# Patient Record
Sex: Female | Born: 1985 | Race: Black or African American | Hispanic: No | Marital: Single | Smoking: Current every day smoker
Health system: Southern US, Community
[De-identification: ages and names within clinical notes are randomized; demographics above are authoritative.]

## PROBLEM LIST (undated history)

## (undated) ENCOUNTER — Inpatient Hospital Stay (HOSPITAL_COMMUNITY): Payer: Self-pay

## (undated) DIAGNOSIS — R87629 Unspecified abnormal cytological findings in specimens from vagina: Secondary | ICD-10-CM

## (undated) HISTORY — PX: TONSILLECTOMY: SUR1361

---
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
## 2008-01-12 ENCOUNTER — Inpatient Hospital Stay (HOSPITAL_COMMUNITY): Admission: AD | Admit: 2008-01-12 | Discharge: 2008-01-12 | Payer: Self-pay | Admitting: Family Medicine

## 2008-03-20 ENCOUNTER — Inpatient Hospital Stay (HOSPITAL_COMMUNITY): Admission: AD | Admit: 2008-03-20 | Discharge: 2008-03-20 | Payer: Self-pay | Admitting: Obstetrics and Gynecology

## 2008-08-27 ENCOUNTER — Inpatient Hospital Stay (HOSPITAL_COMMUNITY): Admission: AD | Admit: 2008-08-27 | Discharge: 2008-08-30 | Payer: Self-pay | Admitting: Obstetrics and Gynecology

## 2008-11-16 ENCOUNTER — Emergency Department (HOSPITAL_COMMUNITY): Admission: EM | Admit: 2008-11-16 | Discharge: 2008-11-16 | Payer: Self-pay | Admitting: Emergency Medicine

## 2009-07-15 ENCOUNTER — Inpatient Hospital Stay (HOSPITAL_COMMUNITY): Admission: AD | Admit: 2009-07-15 | Discharge: 2009-07-15 | Payer: Self-pay | Admitting: Obstetrics & Gynecology

## 2009-11-19 ENCOUNTER — Emergency Department (HOSPITAL_COMMUNITY): Admission: EM | Admit: 2009-11-19 | Discharge: 2009-11-19 | Payer: Self-pay | Admitting: Emergency Medicine

## 2010-02-03 ENCOUNTER — Inpatient Hospital Stay (HOSPITAL_COMMUNITY): Admission: AD | Admit: 2010-02-03 | Discharge: 2010-02-05 | Payer: Self-pay | Admitting: Obstetrics and Gynecology

## 2010-06-08 ENCOUNTER — Emergency Department (HOSPITAL_COMMUNITY)
Admission: EM | Admit: 2010-06-08 | Discharge: 2010-06-08 | Payer: Self-pay | Source: Home / Self Care | Admitting: Emergency Medicine

## 2010-08-06 ENCOUNTER — Emergency Department (HOSPITAL_COMMUNITY)
Admission: EM | Admit: 2010-08-06 | Discharge: 2010-08-06 | Disposition: A | Discharge status: Home / Self Care | Payer: BC Managed Care – PPO | Attending: Emergency Medicine | Admitting: Emergency Medicine

## 2010-08-06 DIAGNOSIS — M4120 Other idiopathic scoliosis, site unspecified: Secondary | ICD-10-CM | POA: Insufficient documentation

## 2010-08-06 DIAGNOSIS — J45909 Unspecified asthma, uncomplicated: Secondary | ICD-10-CM | POA: Insufficient documentation

## 2010-08-06 DIAGNOSIS — R300 Dysuria: Secondary | ICD-10-CM | POA: Insufficient documentation

## 2010-08-06 DIAGNOSIS — R110 Nausea: Secondary | ICD-10-CM | POA: Insufficient documentation

## 2010-08-06 DIAGNOSIS — N390 Urinary tract infection, site not specified: Secondary | ICD-10-CM | POA: Insufficient documentation

## 2010-08-06 DIAGNOSIS — R109 Unspecified abdominal pain: Secondary | ICD-10-CM | POA: Insufficient documentation

## 2010-08-06 LAB — URINALYSIS, ROUTINE W REFLEX MICROSCOPIC
Bilirubin Urine: NEGATIVE
Glucose, UA: NEGATIVE mg/dL
Ketones, ur: NEGATIVE mg/dL
Nitrite: NEGATIVE
Protein, ur: 30 mg/dL — AB
Specific Gravity, Urine: 1.023 (ref 1.005–1.030)
Urobilinogen, UA: 1 mg/dL (ref 0.0–1.0)
pH: 8 (ref 5.0–8.0)

## 2010-08-06 LAB — URINE MICROSCOPIC-ADD ON

## 2010-08-06 LAB — POCT PREGNANCY, URINE: Preg Test, Ur: NEGATIVE

## 2010-08-08 LAB — CBC
HCT: 30 % — ABNORMAL LOW (ref 36.0–46.0)
HCT: 31.2 % — ABNORMAL LOW (ref 36.0–46.0)
Hemoglobin: 10.4 g/dL — ABNORMAL LOW (ref 12.0–15.0)
Hemoglobin: 10.9 g/dL — ABNORMAL LOW (ref 12.0–15.0)
MCH: 32.6 pg (ref 26.0–34.0)
MCH: 32.9 pg (ref 26.0–34.0)
MCHC: 34.7 g/dL (ref 30.0–36.0)
MCHC: 34.7 g/dL (ref 30.0–36.0)
MCV: 93.8 fL (ref 78.0–100.0)
MCV: 94.8 fL (ref 78.0–100.0)
Platelets: 248 10*3/uL (ref 150–400)
Platelets: 281 10*3/uL (ref 150–400)
RBC: 3.16 MIL/uL — ABNORMAL LOW (ref 3.87–5.11)
RBC: 3.33 MIL/uL — ABNORMAL LOW (ref 3.87–5.11)
RDW: 13.9 % (ref 11.5–15.5)
RDW: 14.2 % (ref 11.5–15.5)
WBC: 13.6 10*3/uL — ABNORMAL HIGH (ref 4.0–10.5)
WBC: 14.8 10*3/uL — ABNORMAL HIGH (ref 4.0–10.5)

## 2010-08-08 LAB — RPR: RPR Ser Ql: NONREACTIVE

## 2010-09-04 LAB — CBC
HCT: 29.1 % — ABNORMAL LOW (ref 36.0–46.0)
HCT: 34.5 % — ABNORMAL LOW (ref 36.0–46.0)
Hemoglobin: 11.9 g/dL — ABNORMAL LOW (ref 12.0–15.0)
Hemoglobin: 9.9 g/dL — ABNORMAL LOW (ref 12.0–15.0)
MCHC: 33.9 g/dL (ref 30.0–36.0)
MCHC: 34.5 g/dL (ref 30.0–36.0)
MCV: 93.8 fL (ref 78.0–100.0)
MCV: 95.5 fL (ref 78.0–100.0)
Platelets: 213 10*3/uL (ref 150–400)
Platelets: 293 10*3/uL (ref 150–400)
RBC: 3.04 MIL/uL — ABNORMAL LOW (ref 3.87–5.11)
RBC: 3.68 MIL/uL — ABNORMAL LOW (ref 3.87–5.11)
RDW: 14.2 % (ref 11.5–15.5)
RDW: 14.5 % (ref 11.5–15.5)
WBC: 11.6 10*3/uL — ABNORMAL HIGH (ref 4.0–10.5)
WBC: 11.6 10*3/uL — ABNORMAL HIGH (ref 4.0–10.5)

## 2010-09-04 LAB — URINE MICROSCOPIC-ADD ON

## 2010-09-04 LAB — URINALYSIS, ROUTINE W REFLEX MICROSCOPIC
Bilirubin Urine: NEGATIVE
Glucose, UA: NEGATIVE mg/dL
Ketones, ur: NEGATIVE mg/dL
Leukocytes, UA: NEGATIVE
Nitrite: NEGATIVE
Protein, ur: NEGATIVE mg/dL
Specific Gravity, Urine: 1.02 (ref 1.005–1.030)
Urobilinogen, UA: 0.2 mg/dL (ref 0.0–1.0)
pH: 6.5 (ref 5.0–8.0)

## 2010-09-04 LAB — RPR: RPR Ser Ql: NONREACTIVE
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
## 2011-02-25 LAB — URINALYSIS, ROUTINE W REFLEX MICROSCOPIC
Bilirubin Urine: NEGATIVE
Glucose, UA: NEGATIVE
Hgb urine dipstick: NEGATIVE
Ketones, ur: NEGATIVE
Nitrite: NEGATIVE
Protein, ur: NEGATIVE
Specific Gravity, Urine: 1.025
Urobilinogen, UA: 0.2
pH: 5.5

## 2011-02-25 LAB — GC/CHLAMYDIA PROBE AMP, GENITAL
Chlamydia, DNA Probe: NEGATIVE
GC Probe Amp, Genital: NEGATIVE

## 2011-02-28 ENCOUNTER — Emergency Department (HOSPITAL_COMMUNITY)
Admission: EM | Admit: 2011-02-28 | Discharge: 2011-02-28 | Discharge status: Against Medical Advice | Payer: Self-pay | Attending: Emergency Medicine | Admitting: Emergency Medicine

## 2011-06-20 ENCOUNTER — Emergency Department (HOSPITAL_COMMUNITY)
Admission: EM | Admit: 2011-06-20 | Discharge: 2011-06-20 | Disposition: A | Discharge status: Home / Self Care | Payer: Medicaid Other | Source: Home / Self Care | Attending: Family Medicine | Admitting: Family Medicine

## 2011-06-20 ENCOUNTER — Encounter (HOSPITAL_COMMUNITY): Payer: Self-pay | Admitting: *Deleted

## 2011-06-20 DIAGNOSIS — N760 Acute vaginitis: Secondary | ICD-10-CM

## 2011-06-20 LAB — POCT URINALYSIS DIP (DEVICE)
Bilirubin Urine: NEGATIVE
Glucose, UA: NEGATIVE mg/dL
Hgb urine dipstick: NEGATIVE
Leukocytes, UA: NEGATIVE
Nitrite: NEGATIVE
Protein, ur: NEGATIVE mg/dL
Specific Gravity, Urine: 1.02 (ref 1.005–1.030)
Urobilinogen, UA: 0.2 mg/dL (ref 0.0–1.0)
pH: 5.5 (ref 5.0–8.0)

## 2011-06-20 LAB — WET PREP, GENITAL
Trich, Wet Prep: NONE SEEN
Yeast Wet Prep HPF POC: NONE SEEN

## 2011-06-20 LAB — POCT PREGNANCY, URINE: Preg Test, Ur: NEGATIVE

## 2011-06-20 MED ORDER — AZITHROMYCIN 250 MG PO TABS
1000.0000 mg | ORAL_TABLET | Freq: Every day | ORAL | Status: DC
  Administered 2011-06-20 (×2): 1000 mg via ORAL

## 2011-06-20 MED ORDER — AZITHROMYCIN 250 MG PO TABS
ORAL_TABLET | ORAL | Status: AC
  Filled 2011-06-20: qty 4

## 2011-06-20 MED ORDER — METRONIDAZOLE 250 MG PO TABS: 250.0000 mg | ORAL_TABLET | Freq: Three times a day (TID) | ORAL | Status: AC

## 2011-06-20 NOTE — ED Notes (Signed)
PT  REPORTS  SYMPTOMS  OF  VAGINAL  DISCHARGE  AND  R  SIDE  PAIN X  3  DAYS    SHE  APPEARS  IN NO  ACUTE  DISTRESS    SITTING  URIGHT    ON  EXAM TABLE  SPEAKING IN  COMPLETE  SENTANCES

## 2011-06-20 NOTE — ED Provider Notes (Signed)
History     CSN: [id number]  Arrival date & time 06/20/11  1205   First MD Initiated Contact with Patient 06/20/11 1228      Chief Complaint  Patient presents with  . Vaginal Discharge    (Consider location/radiation/quality/duration/timing/severity/associated sxs/prior treatment) Patient is a 26 y.o. female presenting with vaginal discharge. The history is provided by the patient.  Vaginal Discharge This is a new (h/o chlamydia.) problem. The current episode started more than 2 days ago. The problem occurs constantly. The problem has been gradually worsening. Associated symptoms include abdominal pain.    Past Medical History  Diagnosis Date  . Asthma     History reviewed. No pertinent past surgical history.  Family History  Problem Relation Age of Onset  . Cancer Other   . Diabetes Other   . Hypertension Other     History  Substance Use Topics  . Smoking status: Current Everyday Smoker  . Smokeless tobacco: Not on file  . Alcohol Use: No    OB History    Grav Para Term Preterm Abortions TAB SAB Ect Mult Living                  Review of Systems  Constitutional: Negative.   Gastrointestinal: Positive for abdominal pain. Negative for nausea and vomiting.  Genitourinary: Positive for vaginal discharge. Negative for dysuria, urgency and frequency.    Allergies  Penicillins  Home Medications   Current Outpatient Rx  Name Route Sig Dispense Refill  . ALBUTEROL SULFATE HFA 108 (90 BASE) MCG/ACT IN AERS Inhalation Inhale 2 puffs into the lungs every 6 (six) hours as needed.    Marland Kitchen METRONIDAZOLE 250 MG PO TABS Oral Take 1 tablet (250 mg total) by mouth 3 (three) times daily. 21 tablet 0    BP 114/62  Pulse 65  Temp 98.5 F (36.9 C)  Resp 20  SpO2 100%  LMP 05/10/2011  Physical Exam  Nursing note and vitals reviewed. Constitutional: She appears well-developed and well-nourished.  Abdominal: Soft. Bowel sounds are normal. She exhibits no distension and  no mass. There is no tenderness. There is no rebound and no guarding.  Genitourinary: Uterus normal. Cervix exhibits no motion tenderness, no discharge and no friability. There is erythema and tenderness around the vagina. Vaginal discharge found.    ED Course  Procedures (including critical care time)  Labs Reviewed  POCT URINALYSIS DIP (DEVICE) - Abnormal; Notable for the following:    Ketones, ur TRACE (*)    All other components within normal limits  POCT PREGNANCY, URINE  POCT URINALYSIS DIPSTICK  POCT PREGNANCY, URINE  GC/CHLAMYDIA PROBE AMP, GENITAL  WET PREP, GENITAL   No results found.   1. Vaginitis       MDM  U/a and preg. NegBarkley Bruns, MD 06/20/11 1400

## 2011-06-21 LAB — GC/CHLAMYDIA PROBE AMP, GENITAL
Chlamydia, DNA Probe: NEGATIVE
GC Probe Amp, Genital: NEGATIVE

## 2011-06-23 NOTE — ED Notes (Signed)
GC/Chlamyida neg., Wet prep: Many clue cells, many WBC's. Pt. adequately treated with Flagyl. Cherly Anderson M\ 06/23/2011

## 2012-11-15 ENCOUNTER — Emergency Department (HOSPITAL_COMMUNITY): Payer: 59

## 2012-11-15 ENCOUNTER — Encounter (HOSPITAL_COMMUNITY): Payer: Self-pay | Admitting: *Deleted

## 2012-11-15 ENCOUNTER — Emergency Department (HOSPITAL_COMMUNITY)
Admission: EM | Admit: 2012-11-15 | Discharge: 2012-11-16 | Disposition: A | Discharge status: Home / Self Care | Payer: 59 | Attending: Emergency Medicine | Admitting: Emergency Medicine

## 2012-11-15 DIAGNOSIS — Y9389 Activity, other specified: Secondary | ICD-10-CM | POA: Insufficient documentation

## 2012-11-15 DIAGNOSIS — F17200 Nicotine dependence, unspecified, uncomplicated: Secondary | ICD-10-CM | POA: Insufficient documentation

## 2012-11-15 DIAGNOSIS — IMO0002 Reserved for concepts with insufficient information to code with codable children: Secondary | ICD-10-CM | POA: Insufficient documentation

## 2012-11-15 DIAGNOSIS — Z880 Allergy status to penicillin: Secondary | ICD-10-CM | POA: Insufficient documentation

## 2012-11-15 DIAGNOSIS — J45909 Unspecified asthma, uncomplicated: Secondary | ICD-10-CM | POA: Insufficient documentation

## 2012-11-15 DIAGNOSIS — S46911A Strain of unspecified muscle, fascia and tendon at shoulder and upper arm level, right arm, initial encounter: Secondary | ICD-10-CM

## 2012-11-15 DIAGNOSIS — Z79899 Other long term (current) drug therapy: Secondary | ICD-10-CM | POA: Insufficient documentation

## 2012-11-15 DIAGNOSIS — Y92410 Unspecified street and highway as the place of occurrence of the external cause: Secondary | ICD-10-CM | POA: Insufficient documentation
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
## 2012-11-16 MED ORDER — TRAMADOL HCL 50 MG PO TABS: 50.0000 mg | ORAL_TABLET | Freq: Four times a day (QID) | ORAL | Status: DC | PRN

## 2012-11-16 MED ORDER — IBUPROFEN 600 MG PO TABS: 600.0000 mg | ORAL_TABLET | Freq: Four times a day (QID) | ORAL | Status: DC | PRN

## 2012-11-16 MED ORDER — CYCLOBENZAPRINE HCL 10 MG PO TABS: 10.0000 mg | ORAL_TABLET | Freq: Two times a day (BID) | ORAL | Status: DC | PRN

## 2012-11-16 NOTE — ED Provider Notes (Signed)
Medical screening examination/treatment/procedure(s) were performed by non-physician practitioner and as supervising physician I was immediately available for consultation/collaboration.  Danille Oppedisano, MD 11/16/12 1441 

## 2013-01-23 ENCOUNTER — Encounter (HOSPITAL_COMMUNITY): Payer: Self-pay | Admitting: Physical Medicine and Rehabilitation

## 2013-01-23 ENCOUNTER — Emergency Department (HOSPITAL_COMMUNITY)
Admission: EM | Admit: 2013-01-23 | Discharge: 2013-01-23 | Disposition: A | Discharge status: Home / Self Care | Payer: 59 | Attending: Emergency Medicine | Admitting: Emergency Medicine

## 2013-01-23 DIAGNOSIS — Z880 Allergy status to penicillin: Secondary | ICD-10-CM | POA: Insufficient documentation

## 2013-01-23 DIAGNOSIS — J45909 Unspecified asthma, uncomplicated: Secondary | ICD-10-CM | POA: Insufficient documentation

## 2013-01-23 DIAGNOSIS — F17200 Nicotine dependence, unspecified, uncomplicated: Secondary | ICD-10-CM | POA: Insufficient documentation

## 2013-01-23 DIAGNOSIS — Z79899 Other long term (current) drug therapy: Secondary | ICD-10-CM | POA: Insufficient documentation

## 2013-01-23 DIAGNOSIS — K089 Disorder of teeth and supporting structures, unspecified: Secondary | ICD-10-CM | POA: Insufficient documentation

## 2013-01-23 DIAGNOSIS — K0889 Other specified disorders of teeth and supporting structures: Secondary | ICD-10-CM

## 2013-01-23 DIAGNOSIS — G529 Cranial nerve disorder, unspecified: Secondary | ICD-10-CM | POA: Insufficient documentation

## 2013-01-23 MED ORDER — CLINDAMYCIN HCL 150 MG PO CAPS: 300.0000 mg | ORAL_CAPSULE | Freq: Three times a day (TID) | ORAL | Status: DC

## 2013-01-23 NOTE — ED Provider Notes (Signed)
Medical screening examination/treatment/procedure(s) were performed by non-physician practitioner and as supervising physician I was immediately available for consultation/collaboration.    Ceniyah Thorp D Deidrick Rainey, MD 01/23/13 1943 

## 2013-01-23 NOTE — ED Notes (Signed)
Pt presents to department for evaluation of R upper molar pain. Ongoing x2 days. 10/10 pain upon arrival. No facial swelling noted. Respirations unlabored.

## 2013-01-23 NOTE — ED Provider Notes (Signed)
CSN: [id number]     Arrival date & time 01/23/13  1332 History  This chart was scribed for non-physician practitioner Elpidio Anis, PA-C, working with Vida Roller, MD, by Yevette Edwards, ED Scribe. This patient was seen in room TR04C/TR04C and the patient's care was started at 3:26 PM.   First MD Initiated Contact with Patient 01/23/13 1521   Chief Complaint  Patient presents with  . Dental Pain    The history is provided by the patient. No language interpreter was used.   HPI Comments: Donna Parks is a 27 y.o. female who presents to the Emergency Department complaining of gradually-increasing upper, right-sided dental pain which has been occurring for two days. The pt rates the pain as 10/10. She had a root canal performed several years ago on the affected tooth. The pt denies facial swelling or a fever. Yesterday, she began retaking an antibiotic which she was prescribed approximately two months ago after she had her wisdom teeth removed and which she did not originally finish. She is also taking percocet and motrin 800 mg for the current pain, but with little resolution.   Past Medical History  Diagnosis Date  . Asthma    No past surgical history on file. Family History  Problem Relation Age of Onset  . Cancer Other   . Diabetes Other   . Hypertension Other    History  Substance Use Topics  . Smoking status: Current Every Day Smoker -- 0.50 packs/day    Types: Cigarettes  . Smokeless tobacco: Not on file  . Alcohol Use: No   No OB history provided.  Review of Systems  Constitutional: Negative for fever and chills.  HENT: Positive for dental problem. Negative for facial swelling.   All other systems reviewed and are negative.   Allergies  Penicillins  Home Medications   Current Outpatient Rx  Name  Route  Sig  Dispense  Refill  . albuterol (PROVENTIL HFA;VENTOLIN HFA) 108 (90 BASE) MCG/ACT inhaler   Inhalation   Inhale 2 puffs into the lungs every 6 (six)  hours as needed for wheezing or shortness of breath.          . cyclobenzaprine (FLEXERIL) 10 MG tablet   Oral   Take 1 tablet (10 mg total) by mouth 2 (two) times daily as needed for muscle spasms.   20 tablet   0   . ibuprofen (ADVIL,MOTRIN) 600 MG tablet   Oral   Take 1 tablet (600 mg total) by mouth every 6 (six) hours as needed for pain.   30 tablet   0   . ibuprofen (ADVIL,MOTRIN) 800 MG tablet   Oral   Take 800 mg by mouth every 8 (eight) hours as needed for pain.         . traMADol (ULTRAM) 50 MG tablet   Oral   Take 1 tablet (50 mg total) by mouth every 6 (six) hours as needed for pain.   15 tablet   0    Triage Vitals: BP 137/77  Pulse 68  Temp(Src) 98 F (36.7 C) (Oral)  Resp 18  SpO2 98%  Physical Exam  Nursing note and vitals reviewed. Constitutional: She is oriented to person, place, and time. She appears well-developed and well-nourished. No distress.  HENT:  Head: Normocephalic and atraumatic.  No submental or cervical adenopathy.  Upper right second molar with mild decay. No surrounding abscess evident. Tender alvelolar ridge. Generally good dentition.   Eyes: EOM are normal.  Neck: Neck supple. No tracheal deviation present.  Cardiovascular: Normal rate.   Pulmonary/Chest: Effort normal. No respiratory distress.  Musculoskeletal: Normal range of motion.  Neurological: She is alert and oriented to person, place, and time. A cranial nerve deficit is present.  Skin: Skin is warm and dry.  Psychiatric: She has a normal mood and affect. Her behavior is normal.    ED Course  Procedures (including critical care time) Attempt to relieve pain with local nerve block with marcaine unsuccessful.  DIAGNOSTIC STUDIES: Oxygen Saturation is 98% on room air, normal by my interpretation.    COORDINATION OF CARE:  3:32 PM-Discussed treatment plan with patient, and the patient agreed to the plan.   3:38 PM- Numbed the pt's tooth.    Labs Review Labs  Reviewed - No data to display Imaging Review No results found.  MDM  No diagnosis found. 1. Dental pain  Patient is already taking Rx Percocet (10/325) and Clindamycin in an appropriate dose. She has outpatient follow up with dentist. Discussed ibuprofen use. She states she is ready for discharge - all questions answered.  I personally performed the services described in this documentation, which was scribed in my presence. The recorded information has been reviewed and is accurate.      Arnoldo Hooker, PA-C 01/23/13 1704

## 2013-01-23 NOTE — ED Notes (Signed)
Unable to locate

## 2013-08-23 ENCOUNTER — Encounter (HOSPITAL_COMMUNITY): Payer: Self-pay | Admitting: Emergency Medicine

## 2013-08-23 ENCOUNTER — Emergency Department (HOSPITAL_COMMUNITY)
Admission: EM | Admit: 2013-08-23 | Discharge: 2013-08-23 | Disposition: A | Discharge status: Home / Self Care | Payer: Medicaid Other | Attending: Emergency Medicine | Admitting: Emergency Medicine

## 2013-08-23 DIAGNOSIS — Z880 Allergy status to penicillin: Secondary | ICD-10-CM | POA: Insufficient documentation

## 2013-08-23 DIAGNOSIS — J45909 Unspecified asthma, uncomplicated: Secondary | ICD-10-CM | POA: Insufficient documentation

## 2013-08-23 DIAGNOSIS — F17200 Nicotine dependence, unspecified, uncomplicated: Secondary | ICD-10-CM | POA: Insufficient documentation

## 2013-08-23 DIAGNOSIS — R52 Pain, unspecified: Secondary | ICD-10-CM | POA: Insufficient documentation

## 2013-08-23 DIAGNOSIS — Z3202 Encounter for pregnancy test, result negative: Secondary | ICD-10-CM | POA: Insufficient documentation

## 2013-08-23 DIAGNOSIS — Z792 Long term (current) use of antibiotics: Secondary | ICD-10-CM | POA: Insufficient documentation

## 2013-08-23 DIAGNOSIS — J020 Streptococcal pharyngitis: Secondary | ICD-10-CM | POA: Insufficient documentation

## 2013-08-23 LAB — URINALYSIS, ROUTINE W REFLEX MICROSCOPIC
Bilirubin Urine: NEGATIVE
Glucose, UA: NEGATIVE mg/dL
Hgb urine dipstick: NEGATIVE
Ketones, ur: NEGATIVE mg/dL
LEUKOCYTES UA: NEGATIVE
NITRITE: NEGATIVE
PH: 5.5 (ref 5.0–8.0)
Protein, ur: NEGATIVE mg/dL
SPECIFIC GRAVITY, URINE: 1.025 (ref 1.005–1.030)
Urobilinogen, UA: 0.2 mg/dL (ref 0.0–1.0)

## 2013-08-23 LAB — RAPID STREP SCREEN (MED CTR MEBANE ONLY): Streptococcus, Group A Screen (Direct): POSITIVE — AB

## 2013-08-23 LAB — PREGNANCY, URINE: PREG TEST UR: NEGATIVE

## 2013-08-23 MED ORDER — HYDROCODONE-ACETAMINOPHEN 7.5-325 MG/15ML PO SOLN: 15.0000 mL | Freq: Three times a day (TID) | ORAL | Status: DC | PRN

## 2013-08-23 MED ORDER — AZITHROMYCIN 250 MG PO TABS: 250.0000 mg | ORAL_TABLET | Freq: Every day | ORAL | Status: DC

## 2013-08-23 NOTE — ED Notes (Signed)
Pt in c/o cough, congestion, body aches, and sore throat over the last week, intermittent, fever, no distress noted

## 2013-08-23 NOTE — Discharge Instructions (Signed)
Call for a follow up appointment with a Family or Primary Care Provider.  °Return if Symptoms worsen.   °Take medication as prescribed.  ° °

## 2013-08-23 NOTE — ED Provider Notes (Signed)
CSN: [id number]     Arrival date & time 08/23/13  0940 History   First MD Initiated Contact with Patient 08/23/13 0945     No chief complaint on file.    (Consider location/radiation/quality/duration/timing/severity/associated sxs/prior Treatment) HPI Comments: Donna Parks is a 28 y.o. female presenting the Emergency Department with a chief complaint of persistent sore throat for 1 week.  She reports associated generalize body aches and weakness.  She reports intermittent fever.  The patient also complains of one episode of emesis 5 days ago.  She reports minima relief of symptoms with ibuprofen. LNMP: 2 weeks ago   The history is provided by the patient. No language interpreter was used.    Past Medical History  Diagnosis Date  . Asthma    No past surgical history on file. Family History  Problem Relation Age of Onset  . Cancer Other   . Diabetes Other   . Hypertension Other    History  Substance Use Topics  . Smoking status: Current Every Day Smoker -- 0.50 packs/day    Types: Cigarettes  . Smokeless tobacco: Not on file  . Alcohol Use: No   OB History   Grav Para Term Preterm Abortions TAB SAB Ect Mult Living                 Review of Systems  Constitutional: Positive for fever, chills and fatigue.  HENT: Positive for sinus pressure and sneezing. Negative for congestion and ear pain.   Respiratory: Negative for cough and shortness of breath.   Genitourinary: Negative for dysuria, frequency, hematuria and flank pain.  Musculoskeletal: Positive for myalgias.  Skin: Negative for rash.      Allergies  Penicillins  Home Medications   Current Outpatient Rx  Name  Route  Sig  Dispense  Refill  . albuterol (PROVENTIL HFA;VENTOLIN HFA) 108 (90 BASE) MCG/ACT inhaler   Inhalation   Inhale 2 puffs into the lungs every 6 (six) hours as needed for wheezing or shortness of breath.          . clindamycin (CLEOCIN) 150 MG capsule   Oral   Take 2 capsules (300 mg  total) by mouth 3 (three) times daily.   15 capsule   0   . cyclobenzaprine (FLEXERIL) 10 MG tablet   Oral   Take 1 tablet (10 mg total) by mouth 2 (two) times daily as needed for muscle spasms.   20 tablet   0   . ibuprofen (ADVIL,MOTRIN) 600 MG tablet   Oral   Take 1 tablet (600 mg total) by mouth every 6 (six) hours as needed for pain.   30 tablet   0   . ibuprofen (ADVIL,MOTRIN) 800 MG tablet   Oral   Take 800 mg by mouth every 8 (eight) hours as needed for pain.         . traMADol (ULTRAM) 50 MG tablet   Oral   Take 1 tablet (50 mg total) by mouth every 6 (six) hours as needed for pain.   15 tablet   0    There were no vitals taken for this visit. Physical Exam  Nursing note and vitals reviewed. Constitutional: She appears well-developed and well-nourished. No distress.  HENT:  Head: Normocephalic and atraumatic.  Right Ear: Tympanic membrane and external ear normal. No middle ear effusion.  Left Ear: Tympanic membrane and external ear normal.  No middle ear effusion.  Nose: Rhinorrhea present.  Mouth/Throat: Uvula is midline and mucous membranes  are normal. Mucous membranes are not dry. No oral lesions. No trismus in the jaw. No uvula swelling. Oropharyngeal exudate and posterior oropharyngeal erythema present. No posterior oropharyngeal edema or tonsillar abscesses.  Eyes: EOM are normal. Pupils are equal, round, and reactive to light. Right eye exhibits no discharge. Left eye exhibits no discharge.  Neck: Normal range of motion. Neck supple.  Cardiovascular: Normal rate and regular rhythm.   No murmur heard. Pulmonary/Chest: Effort normal and breath sounds normal. No respiratory distress. She has no wheezes. She has no rales.  Abdominal: Soft. Normal appearance. She exhibits no distension. There is no tenderness. There is no rebound and no CVA tenderness.  Lymphadenopathy:       Head (right side): Tonsillar adenopathy present. No submental, no submandibular, no  preauricular, no posterior auricular and no occipital adenopathy present.       Head (left side): Tonsillar adenopathy present. No submental, no submandibular, no preauricular, no posterior auricular and no occipital adenopathy present.    She has no cervical adenopathy.  Skin: Skin is warm and dry. No rash noted. She is not diaphoretic.  Psychiatric: She has a normal mood and affect. Her behavior is normal. Thought content normal.    ED Course  Procedures (including critical care time) Labs Review Labs Reviewed  RAPID STREP SCREEN - Abnormal; Notable for the following:    Streptococcus, Group A Screen (Direct) POSITIVE (*)    All other components within normal limits  URINALYSIS, ROUTINE W REFLEX MICROSCOPIC - Abnormal; Notable for the following:    APPearance CLOUDY (*)    All other components within normal limits  PREGNANCY, URINE   Imaging Review No results found.   EKG Interpretation None      MDM   Final diagnoses:  Strep throat   Pt with ongoing sore throat for 1 week.  Report nausea an vomiting, no abdominal pain, UA to check for possible UTI. Strep positive, UA negative. PCN allergy, will discharge with a Z-pak  Discussed lab results and treatment plan with the patient. Return precautions given. Reports understanding and no other concerns at this time.  Patient is stable for discharge at this time. Meds given in ED:  Medications - No data to display  Discharge Medication List as of 08/23/2013 11:04 AM    START taking these medications   Details  azithromycin (ZITHROMAX Z-PAK) 250 MG tablet Take 1 tablet (250 mg total) by mouth daily. 500mg  PO day 1, then 250mg  PO days 205, Starting 08/23/2013, Until Discontinued, Print    HYDROcodone-acetaminophen (HYCET) 7.5-325 mg/15 ml solution Take 15 mLs by mouth every 8 (eight) hours as needed for moderate pain., Starting 08/23/2013, Until Discontinued, Print             Clabe SealLauren M Embry Manrique, PA-C 08/23/13 1119

## 2013-08-24 NOTE — ED Provider Notes (Signed)
Medical screening examination/treatment/procedure(s) were performed by non-physician practitioner and as supervising physician I was immediately available for consultation/collaboration.   EKG Interpretation None        Charles B. Bernette MayersSheldon, MD 08/24/13 [phone]

## 2013-09-27 ENCOUNTER — Encounter (HOSPITAL_COMMUNITY): Payer: Self-pay | Admitting: Emergency Medicine

## 2013-09-27 ENCOUNTER — Emergency Department (HOSPITAL_COMMUNITY)
Admission: EM | Admit: 2013-09-27 | Discharge: 2013-09-27 | Disposition: A | Discharge status: Home / Self Care | Payer: 59 | Attending: Emergency Medicine | Admitting: Emergency Medicine

## 2013-09-27 DIAGNOSIS — B9689 Other specified bacterial agents as the cause of diseases classified elsewhere: Secondary | ICD-10-CM | POA: Insufficient documentation

## 2013-09-27 DIAGNOSIS — Z880 Allergy status to penicillin: Secondary | ICD-10-CM | POA: Insufficient documentation

## 2013-09-27 DIAGNOSIS — F17200 Nicotine dependence, unspecified, uncomplicated: Secondary | ICD-10-CM | POA: Insufficient documentation

## 2013-09-27 DIAGNOSIS — Z79899 Other long term (current) drug therapy: Secondary | ICD-10-CM | POA: Insufficient documentation

## 2013-09-27 DIAGNOSIS — Z3202 Encounter for pregnancy test, result negative: Secondary | ICD-10-CM | POA: Insufficient documentation

## 2013-09-27 DIAGNOSIS — A499 Bacterial infection, unspecified: Secondary | ICD-10-CM | POA: Insufficient documentation

## 2013-09-27 DIAGNOSIS — N760 Acute vaginitis: Secondary | ICD-10-CM

## 2013-09-27 DIAGNOSIS — J45909 Unspecified asthma, uncomplicated: Secondary | ICD-10-CM | POA: Insufficient documentation

## 2013-09-27 LAB — COMPREHENSIVE METABOLIC PANEL
ALBUMIN: 3.7 g/dL (ref 3.5–5.2)
ALK PHOS: 59 U/L (ref 39–117)
ALT: 16 U/L (ref 0–35)
AST: 18 U/L (ref 0–37)
BILIRUBIN TOTAL: 0.2 mg/dL — AB (ref 0.3–1.2)
BUN: 16 mg/dL (ref 6–23)
CHLORIDE: 100 meq/L (ref 96–112)
CO2: 24 mEq/L (ref 19–32)
Calcium: 9.3 mg/dL (ref 8.4–10.5)
Creatinine, Ser: 0.91 mg/dL (ref 0.50–1.10)
GFR calc Af Amer: >90 mL/min (ref >90)
GFR calc non Af Amer: 87 mL/min — ABNORMAL LOW (ref >90)
Glucose, Bld: 101 mg/dL — ABNORMAL HIGH (ref 70–99)
POTASSIUM: 4.5 meq/L (ref 3.7–5.3)
Sodium: 135 mEq/L — ABNORMAL LOW (ref 137–147)
Total Protein: 7.1 g/dL (ref 6.0–8.3)

## 2013-09-27 LAB — CBC WITH DIFFERENTIAL/PLATELET
BASOS PCT: 0 % (ref 0–1)
Basophils Absolute: 0 10*3/uL (ref 0.0–0.1)
Eosinophils Absolute: 0.2 10*3/uL (ref 0.0–0.7)
Eosinophils Relative: 2 % (ref 0–5)
HCT: 41.6 % (ref 36.0–46.0)
HEMOGLOBIN: 14.3 g/dL (ref 12.0–15.0)
LYMPHS ABS: 3.3 10*3/uL (ref 0.7–4.0)
Lymphocytes Relative: 38 % (ref 12–46)
MCH: 32.1 pg (ref 26.0–34.0)
MCHC: 34.4 g/dL (ref 30.0–36.0)
MCV: 93.3 fL (ref 78.0–100.0)
MONOS PCT: 8 % (ref 3–12)
Monocytes Absolute: 0.7 10*3/uL (ref 0.1–1.0)
NEUTROS ABS: 4.5 10*3/uL (ref 1.7–7.7)
NEUTROS PCT: 52 % (ref 43–77)
Platelets: 297 10*3/uL (ref 150–400)
RBC: 4.46 MIL/uL (ref 3.87–5.11)
RDW: 13.7 % (ref 11.5–15.5)
WBC: 8.8 10*3/uL (ref 4.0–10.5)

## 2013-09-27 LAB — HIV ANTIBODY (ROUTINE TESTING W REFLEX): HIV 1&2 Ab, 4th Generation: NONREACTIVE

## 2013-09-27 LAB — URINE MICROSCOPIC-ADD ON

## 2013-09-27 LAB — URINALYSIS, ROUTINE W REFLEX MICROSCOPIC
BILIRUBIN URINE: NEGATIVE
GLUCOSE, UA: NEGATIVE mg/dL
HGB URINE DIPSTICK: NEGATIVE
KETONES UR: NEGATIVE mg/dL
Nitrite: NEGATIVE
PROTEIN: NEGATIVE mg/dL
Specific Gravity, Urine: 1.016 (ref 1.005–1.030)
Urobilinogen, UA: 0.2 mg/dL (ref 0.0–1.0)
pH: 6.5 (ref 5.0–8.0)

## 2013-09-27 LAB — WET PREP, GENITAL
TRICH WET PREP: NONE SEEN
YEAST WET PREP: NONE SEEN

## 2013-09-27 LAB — PREGNANCY, URINE: Preg Test, Ur: NEGATIVE

## 2013-09-27 LAB — RPR

## 2013-09-27 MED ORDER — IBUPROFEN 800 MG PO TABS
800.0000 mg | ORAL_TABLET | Freq: Once | ORAL | Status: AC
  Administered 2013-09-27: 800 mg via ORAL
  Filled 2013-09-27: qty 1

## 2013-09-27 MED ORDER — METRONIDAZOLE 500 MG PO TABS: 500.0000 mg | ORAL_TABLET | Freq: Two times a day (BID) | ORAL | Status: DC

## 2013-09-27 MED ORDER — HYDROCODONE-ACETAMINOPHEN 5-325 MG PO TABS: 1.0000 | ORAL_TABLET | Freq: Four times a day (QID) | ORAL | Status: DC | PRN

## 2013-09-27 MED ORDER — IBUPROFEN 800 MG PO TABS: 800.0000 mg | ORAL_TABLET | Freq: Three times a day (TID) | ORAL | Status: DC | PRN

## 2013-09-27 NOTE — Progress Notes (Signed)
[facility] provided pt with a list of primary care resources to help patient establish primary care.  °

## 2013-09-27 NOTE — Discharge Instructions (Signed)

## 2013-09-27 NOTE — ED Notes (Signed)
Pt c/o lower abd pain since yesterday; discharge x 1 wk; pap smear last year abnormal but pt did not return for follow up

## 2013-09-27 NOTE — ED Provider Notes (Signed)
TIME SEEN: 11:40 AM  CHIEF COMPLAINT: Abdominal pain, vaginal discharge  HPI: Patient is a 28 y.o. F G2P2 who presents to the emergency department with intermittent vaginal discharge and diffuse abdominal pain for the past year. She states her symptoms are present for the past week and she decided to come to the emergency department for evaluation. Her last menstrual period was in mid April. She has had a history of Chlamydia that was treated in the genital herpes. She is sexually active with one partner and they always use condoms. She denies any fevers, chills, nausea, vomiting or diarrhea. She has had some urinary frequency but no dysuria or hematuria.  ROS: See HPI Constitutional: no fever  Eyes: no drainage  ENT: no runny nose   Cardiovascular:  no chest pain  Resp: no SOB  GI: no vomiting GU: no dysuria Integumentary: no rash  Allergy: no hives  Musculoskeletal: no leg swelling  Neurological: no slurred speech ROS otherwise negative  PAST MEDICAL HISTORY/PAST SURGICAL HISTORY:  Past Medical History  Diagnosis Date  . Asthma     MEDICATIONS:  Prior to Admission medications   Medication Sig Start Date End Date Taking? Authorizing Provider  acetaminophen (TYLENOL) 325 MG tablet Take 650 mg by mouth every 6 (six) hours as needed (pain).   Yes Historical Provider, MD  albuterol (PROVENTIL HFA;VENTOLIN HFA) 108 (90 BASE) MCG/ACT inhaler Inhale 2 puffs into the lungs every 6 (six) hours as needed for wheezing or shortness of breath.     Historical Provider, MD    ALLERGIES:  Allergies  Allergen Reactions  . Penicillins Hives    SOCIAL HISTORY:  History  Substance Use Topics  . Smoking status: Current Every Day Smoker -- 0.50 packs/day    Types: Cigarettes  . Smokeless tobacco: Not on file  . Alcohol Use: No    FAMILY HISTORY: Family History  Problem Relation Age of Onset  . Cancer Other   . Diabetes Other   . Hypertension Other     EXAM: BP 136/100  Pulse 56   Temp(Src) 97.9 F (36.6 C) (Oral)  Resp 16  SpO2 100%  LMP 09/07/2013 CONSTITUTIONAL: Alert and oriented and responds appropriately to questions. Well-appearing; well-nourished HEAD: Normocephalic EYES: Conjunctivae clear, PERRL ENT: normal nose; no rhinorrhea; moist mucous membranes; pharynx without lesions noted NECK: Supple, no meningismus, no LAD  CARD: RRR; S1 and S2 appreciated; no murmurs, no clicks, no rubs, no gallops RESP: Normal chest excursion without splinting or tachypnea; breath sounds clear and equal bilaterally; no wheezes, no rhonchi, no rales,  ABD/GI: Normal bowel sounds; non-distended; soft, non-tender, no rebound, no guarding GU: Normal external genitalia, small amount of white vaginal discharge, no vaginal bleeding, no cervical motion tenderness, no adnexal tenderness or fullness BACK:  The back appears normal and is non-tender to palpation, there is no CVA tenderness EXT: Normal ROM in all joints; non-tender to palpation; no edema; normal capillary refill; no cyanosis    SKIN: Normal color for age and race; warm NEURO: Moves all extremities equally PSYCH: The patient's mood and manner are appropriate. Grooming and personal hygiene are appropriate.  MEDICAL DECISION MAKING: Patient is very well-appearing, nontoxic and in no apparent distress. Her abdominal exam is completely benign. Differential diagnosis includes pelvic inflammatory disease, UTI, ectopic pregnancy, TIA, less likely torsion or appendicitis. We'll obtain a urine for urinalysis and urine pregnancy, pelvic exam with cultures. Labs are already pending.  ED PROGRESS: Patient's pelvic exam is completely unremarkable. She has no cervical  motion tenderness or adnexal tenderness. Her labs are normal. Her urine shows trace leukocytes but no other sign of infection. Urine pregnancy is negative. Wet prep pending. I do not feel she needs abdominal imaging at this time.   Patient's wet prep shows clue cells but no  other sign of infection. We'll discharge home with prescription for Flagyl and return precautions. Patient states she's also been told she had abnormal Pap smear last year and she never followed up. Discussed with patient that we do not perform Pap smears in the emergency department and urged her to followup with her OB/GYN. Discussed return precautions. She verbalized understanding and is comfortable with plan.     Layla MawKristen N Ward, DO 09/27/13 [phone]

## 2013-09-27 NOTE — ED Notes (Addendum)
Per patient- had pap smear test completed last year and had "abnormal results" but didn't go back to hear the results because she heard the word "cancerous." Reports malodorous dark yellow discharge starting a month ago. Used monistat x2 with no results. Did report itching a month ago but no itching at this time. Still has darker yellow discharge at times. Abdominal pain generalized but denies radiation to shoulder, neck, or back. Last period in April. Hx chlamydia and was treated. Sexually active and using protection with one partner at this time. Denies N, V, chills, D. Ambulatory. A&O x4. No other complaints at this time.

## 2013-09-28 LAB — URINE CULTURE
CULTURE: NO GROWTH
Colony Count: NO GROWTH

## 2013-09-28 LAB — GC/CHLAMYDIA PROBE AMP
CT Probe RNA: NEGATIVE
GC Probe RNA: NEGATIVE

## 2014-08-18 ENCOUNTER — Inpatient Hospital Stay (HOSPITAL_COMMUNITY)
Admission: AD | Admit: 2014-08-18 | Discharge: 2014-08-18 | Disposition: A | Discharge status: Home / Self Care | Payer: Medicaid Other | Source: Ambulatory Visit | Attending: Family Medicine | Admitting: Family Medicine

## 2014-08-18 DIAGNOSIS — N644 Mastodynia: Secondary | ICD-10-CM | POA: Diagnosis not present

## 2014-08-18 DIAGNOSIS — N6452 Nipple discharge: Secondary | ICD-10-CM | POA: Diagnosis not present

## 2014-08-18 LAB — URINE MICROSCOPIC-ADD ON

## 2014-08-18 LAB — URINALYSIS, ROUTINE W REFLEX MICROSCOPIC
Bilirubin Urine: NEGATIVE
Glucose, UA: NEGATIVE mg/dL
Ketones, ur: NEGATIVE mg/dL
Leukocytes, UA: NEGATIVE
NITRITE: NEGATIVE
Protein, ur: NEGATIVE mg/dL
SPECIFIC GRAVITY, URINE: 1.025 (ref 1.005–1.030)
UROBILINOGEN UA: 0.2 mg/dL (ref 0.0–1.0)
pH: 6 (ref 5.0–8.0)

## 2014-08-18 LAB — POCT PREGNANCY, URINE: PREG TEST UR: NEGATIVE

## 2014-08-18 NOTE — MAU Note (Signed)
Called pt from lobby. Not present

## 2014-08-18 NOTE — MAU Note (Signed)
Pt signed AMA paperwork. Did not want to wait to be seen any longer

## 2014-08-18 NOTE — MAU Note (Signed)
Pt presents complaining of white discharge and tenderness in her right nipple. Pt states she squeezed it and white puss and blood came out of it. States the pain feels like it starts in her arm and goes up to her right breast.

## 2014-09-09 ENCOUNTER — Encounter (HOSPITAL_COMMUNITY): Payer: Self-pay | Admitting: Emergency Medicine

## 2014-09-09 ENCOUNTER — Emergency Department (HOSPITAL_COMMUNITY)
Admission: EM | Admit: 2014-09-09 | Discharge: 2014-09-09 | Disposition: A | Discharge status: Home / Self Care | Payer: Medicaid Other | Attending: Emergency Medicine | Admitting: Emergency Medicine

## 2014-09-09 DIAGNOSIS — Z79899 Other long term (current) drug therapy: Secondary | ICD-10-CM | POA: Insufficient documentation

## 2014-09-09 DIAGNOSIS — L509 Urticaria, unspecified: Secondary | ICD-10-CM | POA: Insufficient documentation

## 2014-09-09 DIAGNOSIS — Z880 Allergy status to penicillin: Secondary | ICD-10-CM | POA: Diagnosis not present

## 2014-09-09 DIAGNOSIS — Z720 Tobacco use: Secondary | ICD-10-CM | POA: Diagnosis not present

## 2014-09-09 DIAGNOSIS — Z3202 Encounter for pregnancy test, result negative: Secondary | ICD-10-CM | POA: Insufficient documentation

## 2014-09-09 DIAGNOSIS — J45909 Unspecified asthma, uncomplicated: Secondary | ICD-10-CM | POA: Diagnosis not present

## 2014-09-09 DIAGNOSIS — R51 Headache: Secondary | ICD-10-CM | POA: Diagnosis present

## 2014-09-09 LAB — BASIC METABOLIC PANEL
Anion gap: 7 (ref 5–15)
BUN: 9 mg/dL (ref 6–23)
CO2: 24 mmol/L (ref 19–32)
CREATININE: 0.83 mg/dL (ref 0.50–1.10)
Calcium: 8.5 mg/dL (ref 8.4–10.5)
Chloride: 105 mmol/L (ref 96–112)
GFR calc Af Amer: >90 mL/min (ref >90)
GFR calc non Af Amer: >90 mL/min (ref >90)
GLUCOSE: 101 mg/dL — AB (ref 70–99)
Potassium: 3.8 mmol/L (ref 3.5–5.1)
SODIUM: 136 mmol/L (ref 135–145)

## 2014-09-09 LAB — CBC WITH DIFFERENTIAL/PLATELET
Basophils Absolute: 0 10*3/uL (ref 0.0–0.1)
Basophils Relative: 0 % (ref 0–1)
EOS PCT: 2 % (ref 0–5)
Eosinophils Absolute: 0.1 10*3/uL (ref 0.0–0.7)
HCT: 41.8 % (ref 36.0–46.0)
HEMOGLOBIN: 14.5 g/dL (ref 12.0–15.0)
LYMPHS ABS: 1.5 10*3/uL (ref 0.7–4.0)
Lymphocytes Relative: 28 % (ref 12–46)
MCH: 33 pg (ref 26.0–34.0)
MCHC: 34.7 g/dL (ref 30.0–36.0)
MCV: 95 fL (ref 78.0–100.0)
Monocytes Absolute: 0.5 10*3/uL (ref 0.1–1.0)
Monocytes Relative: 9 % (ref 3–12)
Neutro Abs: 3.4 10*3/uL (ref 1.7–7.7)
Neutrophils Relative %: 61 % (ref 43–77)
Platelets: 283 10*3/uL (ref 150–400)
RBC: 4.4 MIL/uL (ref 3.87–5.11)
RDW: 12.6 % (ref 11.5–15.5)
WBC: 5.5 10*3/uL (ref 4.0–10.5)

## 2014-09-09 LAB — POC URINE PREG, ED: Preg Test, Ur: NEGATIVE

## 2014-09-09 MED ORDER — PREDNISONE 20 MG PO TABS: 60.0000 mg | ORAL_TABLET | Freq: Every day | ORAL | Status: DC

## 2014-09-09 MED ORDER — ACETAMINOPHEN 500 MG PO TABS
1000.0000 mg | ORAL_TABLET | Freq: Once | ORAL | Status: AC
  Administered 2014-09-09: 1000 mg via ORAL
  Filled 2014-09-09: qty 2

## 2014-09-09 MED ORDER — HYDROXYZINE HCL 10 MG PO TABS: 10.0000 mg | ORAL_TABLET | Freq: Three times a day (TID) | ORAL | Status: DC | PRN

## 2014-09-09 MED ORDER — DIPHENHYDRAMINE HCL 25 MG PO CAPS
25.0000 mg | ORAL_CAPSULE | Freq: Once | ORAL | Status: AC
  Administered 2014-09-09: 25 mg via ORAL
  Filled 2014-09-09: qty 1

## 2014-09-09 MED ORDER — METOCLOPRAMIDE HCL 10 MG PO TABS
10.0000 mg | ORAL_TABLET | Freq: Once | ORAL | Status: AC
  Administered 2014-09-09: 10 mg via ORAL
  Filled 2014-09-09: qty 1

## 2014-09-09 NOTE — ED Notes (Signed)
Pt is unable to urinate at this time. 

## 2014-09-09 NOTE — ED Notes (Signed)
Pt reports hives on her face starting an hour ago. Pt reports face has been breaking out lately. Pt also c/o headache. Pt reports it could be the pollen but has no known hx of seasonal allergies. Pt has been taking Benadryl x4 days.

## 2014-09-09 NOTE — Discharge Instructions (Signed)

## 2014-09-09 NOTE — ED Provider Notes (Signed)
CSN: [id number]     Arrival date & time 09/09/14  0428 History   First MD Initiated Contact with Patient 09/09/14 [phone]     Chief Complaint  Patient presents with  . Headache  . Urticaria     (Consider location/radiation/quality/duration/timing/severity/associated sxs/prior Treatment) Patient is a 29 y.o. female presenting with rash.  Rash Location: face, chest, back. Quality: itchiness   Severity:  Moderate Onset quality:  Gradual Duration:  4 days Timing:  Constant Progression:  Worsening Chronicity:  New Context: not exposure to similar rash, not medications and not new detergent/soap   Relieved by:  Nothing Worsened by:  Nothing tried Ineffective treatments:  None tried Associated symptoms: headaches   Associated symptoms: no abdominal pain, no fever, no nausea, no shortness of breath and not vomiting     Past Medical History  Diagnosis Date  . Asthma    History reviewed. No pertinent past surgical history. Family History  Problem Relation Age of Onset  . Cancer Other   . Diabetes Other   . Hypertension Other    History  Substance Use Topics  . Smoking status: Current Every Day Smoker -- 0.50 packs/day    Types: Cigarettes  . Smokeless tobacco: Not on file  . Alcohol Use: No   OB History    No data available     Review of Systems  Constitutional: Negative for fever.  Respiratory: Negative for shortness of breath.   Gastrointestinal: Negative for nausea, vomiting and abdominal pain.  Skin: Positive for rash.  Neurological: Positive for headaches.  All other systems reviewed and are negative.     Allergies  Penicillins  Home Medications   Prior to Admission medications   Medication Sig Start Date End Date Taking? Authorizing Provider  acetaminophen (TYLENOL) 325 MG tablet Take 650 mg by mouth every 6 (six) hours as needed (pain).    Historical Provider, MD  albuterol (PROVENTIL HFA;VENTOLIN HFA) 108 (90 BASE) MCG/ACT inhaler Inhale 2 puffs into  the lungs every 6 (six) hours as needed for wheezing or shortness of breath.     Historical Provider, MD  HYDROcodone-acetaminophen (NORCO/VICODIN) 5-325 MG per tablet Take 1 tablet by mouth every 6 (six) hours as needed. Patient not taking: Reported on 09/09/2014 09/27/13   Layla MawKristen N Ward, DO  hydrOXYzine (ATARAX/VISTARIL) 10 MG tablet Take 1 tablet (10 mg total) by mouth 3 (three) times daily as needed for itching. 09/09/14   Mirian MoMatthew Gentry, MD  ibuprofen (ADVIL,MOTRIN) 800 MG tablet Take 1 tablet (800 mg total) by mouth every 8 (eight) hours as needed for mild pain. Patient not taking: Reported on 09/09/2014 09/27/13   Layla MawKristen N Ward, DO  metroNIDAZOLE (FLAGYL) 500 MG tablet Take 1 tablet (500 mg total) by mouth 2 (two) times daily. Do not drink alcohol with this medication. Patient not taking: Reported on 09/09/2014 09/27/13   Layla MawKristen N Ward, DO  predniSONE (DELTASONE) 20 MG tablet Take 3 tablets (60 mg total) by mouth daily. 09/09/14   Mirian MoMatthew Gentry, MD   BP 128/88 mmHg  Pulse 89  Temp(Src) 99.3 F (37.4 C) (Oral)  Resp 16  Wt 180 lb (81.647 kg)  SpO2 100%  LMP 09/09/2014 Physical Exam  Constitutional: She is oriented to person, place, and time. She appears well-developed and well-nourished.  HENT:  Head: Normocephalic and atraumatic.  Right Ear: External ear normal.  Left Ear: External ear normal.  Eyes: Conjunctivae and EOM are normal. Pupils are equal, round, and reactive to light.  Neck: Normal range  of motion. Neck supple.  Cardiovascular: Normal rate, regular rhythm, normal heart sounds and intact distal pulses.   Pulmonary/Chest: Effort normal and breath sounds normal.  Abdominal: Soft. Bowel sounds are normal. There is no tenderness.  Musculoskeletal: Normal range of motion.  Neurological: She is alert and oriented to person, place, and time.  Skin: Skin is warm and dry. Rash noted.  Urticaria over face, upper chest, back, no oropharyngeal involvement  Vitals reviewed.   ED  Course  Procedures (including critical care time) Labs Review Labs Reviewed  BASIC METABOLIC PANEL - Abnormal; Notable for the following:    Glucose, Bld 101 (*)    All other components within normal limits  CBC WITH DIFFERENTIAL/PLATELET  POC URINE PREG, ED    Imaging Review No results found.   EKG Interpretation None      MDM   Final diagnoses:  Urticaria    29 y.o. female with pertinent PMH of asthma presents with rash as above, as well as headache.  No concerning features of headache historically.  No new exposures, medication, or change in detergent.  Physical exam as above.  Doubt SJS, TEN, or other emergent rash.  Likely viral process given concommitant URI symptoms.  No significant cough.    Wu unremarkable.  Given symptomatic therapy.  DC home in stable condition.    I have reviewed all laboratory and imaging studies if ordered as above  1. Urticaria         Mirian Mo, MD 09/09/14 2322

## 2014-12-07 ENCOUNTER — Emergency Department (HOSPITAL_COMMUNITY)
Admission: EM | Admit: 2014-12-07 | Discharge: 2014-12-07 | Disposition: A | Discharge status: Home / Self Care | Payer: Medicaid Other | Attending: Emergency Medicine | Admitting: Emergency Medicine

## 2014-12-07 ENCOUNTER — Encounter (HOSPITAL_COMMUNITY): Payer: Self-pay | Admitting: Neurology

## 2014-12-07 DIAGNOSIS — R319 Hematuria, unspecified: Secondary | ICD-10-CM | POA: Diagnosis present

## 2014-12-07 DIAGNOSIS — Z880 Allergy status to penicillin: Secondary | ICD-10-CM | POA: Diagnosis not present

## 2014-12-07 DIAGNOSIS — Z720 Tobacco use: Secondary | ICD-10-CM | POA: Insufficient documentation

## 2014-12-07 DIAGNOSIS — N390 Urinary tract infection, site not specified: Secondary | ICD-10-CM | POA: Insufficient documentation

## 2014-12-07 DIAGNOSIS — Z79899 Other long term (current) drug therapy: Secondary | ICD-10-CM | POA: Insufficient documentation

## 2014-12-07 DIAGNOSIS — J45909 Unspecified asthma, uncomplicated: Secondary | ICD-10-CM | POA: Diagnosis not present

## 2014-12-07 DIAGNOSIS — Z3202 Encounter for pregnancy test, result negative: Secondary | ICD-10-CM | POA: Diagnosis not present

## 2014-12-07 DIAGNOSIS — Z7952 Long term (current) use of systemic steroids: Secondary | ICD-10-CM | POA: Insufficient documentation

## 2014-12-07 LAB — URINALYSIS, ROUTINE W REFLEX MICROSCOPIC
Bilirubin Urine: NEGATIVE
Glucose, UA: NEGATIVE mg/dL
Ketones, ur: NEGATIVE mg/dL
Nitrite: POSITIVE — AB
Protein, ur: >300 mg/dL — AB
Specific Gravity, Urine: >1.03 — ABNORMAL HIGH (ref 1.005–1.030)
Urobilinogen, UA: 0.2 mg/dL (ref 0.0–1.0)
pH: 6 (ref 5.0–8.0)

## 2014-12-07 LAB — URINE MICROSCOPIC-ADD ON

## 2014-12-07 LAB — POC URINE PREG, ED: Preg Test, Ur: NEGATIVE

## 2014-12-07 MED ORDER — NITROFURANTOIN MONOHYD MACRO 100 MG PO CAPS
100.0000 mg | ORAL_CAPSULE | Freq: Once | ORAL | Status: AC
  Administered 2014-12-07: 100 mg via ORAL
  Filled 2014-12-07: qty 1

## 2014-12-07 MED ORDER — PHENAZOPYRIDINE HCL 200 MG PO TABS: 200.0000 mg | ORAL_TABLET | Freq: Three times a day (TID) | ORAL | Status: DC | PRN

## 2014-12-07 MED ORDER — NITROFURANTOIN MONOHYD MACRO 100 MG PO CAPS: 100.0000 mg | ORAL_CAPSULE | Freq: Two times a day (BID) | ORAL | Status: DC

## 2014-12-07 MED ORDER — PHENAZOPYRIDINE HCL 100 MG PO TABS
200.0000 mg | ORAL_TABLET | Freq: Once | ORAL | Status: AC
  Administered 2014-12-07: 200 mg via ORAL
  Filled 2014-12-07: qty 2

## 2014-12-07 NOTE — ED Provider Notes (Signed)
CSN: [id number]     Arrival date & time 12/07/14  4098 History  This chart was scribed for non-physician practitioner, Burgess Amor, PA-C, working with Raeford Razor, MD, by Ronney Lion, ED Scribe. This patient was seen in room TR11C/TR11C and the patient's care was started at 9:13 AM.    Chief Complaint  Patient presents with  . Urinary Tract Infection   Patient is a 30 y.o. female presenting with urinary tract infection. The history is provided by the patient. No language interpreter was used.  Urinary Tract Infection Quality: pressure. Pain severity:  Moderate Onset quality:  Gradual Duration:  1 day Timing:  Constant Progression:  Unchanged Chronicity:  New Recent urinary tract infections: no   Relieved by:  Nothing Worsened by:  Nothing tried Ineffective treatments:  Cranberry juice Urinary symptoms: frequent urination and hematuria   Associated symptoms: no abdominal pain, no fever, no flank pain, no nausea and no vomiting   Risk factors: sexually active (with committed partner)   Risk factors: not pregnant     HPI Comments: Donna Parks is a 29 y.o. female who presents to the Emergency Department complaining of constant, moderate, sharp dysuria that occurs at the end of urinating, with onset yesterday. She characterizes it as feeling "like a string is being pulled." She also complains of associated suprapubic pressure, urinary frequency, urgency, and possible hematuria, as she says that she had pink on the toilet paper this morning--although she adds she is just at the end of her menstrual period, so she is unsure whether that may be the cause. Patient drank 1 small bottle of cranberry juice last night with no noticeable improvement. Patient denies any acute back pain, nausea, vomiting, loss of appetite, or abdominal pain. Patient is a smoker. She states she works as a Engineer, mining. She has known allergies to penicillin. Patient is sexually active with one committed partner. She  denies the possibility of pregnancy. She is scheduled to see her PCP in about 11 days, for a pap smear.   Past Medical History  Diagnosis Date  . Asthma    History reviewed. No pertinent past surgical history. Family History  Problem Relation Age of Onset  . Cancer Other   . Diabetes Other   . Hypertension Other    History  Substance Use Topics  . Smoking status: Current Every Day Smoker -- 0.50 packs/day    Types: Cigarettes  . Smokeless tobacco: Not on file  . Alcohol Use: No   OB History    No data available     Review of Systems  Constitutional: Negative for fever, chills and appetite change.  Gastrointestinal: Negative for nausea, vomiting and abdominal pain.  Genitourinary: Positive for dysuria, urgency, frequency and hematuria. Negative for flank pain.  Musculoskeletal: Negative for back pain.  Neurological: Negative for weakness.    Allergies  Penicillins  Home Medications   Prior to Admission medications   Medication Sig Start Date End Date Taking? Authorizing Provider  acetaminophen (TYLENOL) 325 MG tablet Take 650 mg by mouth every 6 (six) hours as needed (pain).    Historical Provider, MD  albuterol (PROVENTIL HFA;VENTOLIN HFA) 108 (90 BASE) MCG/ACT inhaler Inhale 2 puffs into the lungs every 6 (six) hours as needed for wheezing or shortness of breath.     Historical Provider, MD  HYDROcodone-acetaminophen (NORCO/VICODIN) 5-325 MG per tablet Take 1 tablet by mouth every 6 (six) hours as needed. Patient not taking: Reported on 09/09/2014 09/27/13   Layla Maw Ward,  DO  hydrOXYzine (ATARAX/VISTARIL) 10 MG tablet Take 1 tablet (10 mg total) by mouth 3 (three) times daily as needed for itching. 09/09/14   Mirian MoMatthew Gentry, MD  ibuprofen (ADVIL,MOTRIN) 800 MG tablet Take 1 tablet (800 mg total) by mouth every 8 (eight) hours as needed for mild pain. Patient not taking: Reported on 09/09/2014 09/27/13   Layla MawKristen N Ward, DO  metroNIDAZOLE (FLAGYL) 500 MG tablet Take 1 tablet  (500 mg total) by mouth 2 (two) times daily. Do not drink alcohol with this medication. Patient not taking: Reported on 09/09/2014 09/27/13   Layla MawKristen N Ward, DO  nitrofurantoin, macrocrystal-monohydrate, (MACROBID) 100 MG capsule Take 1 capsule (100 mg total) by mouth 2 (two) times daily. 12/07/14   Burgess AmorJulie Tyteanna Ost, PA-C  phenazopyridine (PYRIDIUM) 200 MG tablet Take 1 tablet (200 mg total) by mouth 3 (three) times daily as needed for pain. 12/07/14   Burgess AmorJulie Ramesses Crampton, PA-C  predniSONE (DELTASONE) 20 MG tablet Take 3 tablets (60 mg total) by mouth daily. 09/09/14   Mirian MoMatthew Gentry, MD   BP 114/70 mmHg  Pulse 80  Temp(Src) 97.8 F (36.6 C) (Oral)  Resp 16  SpO2 97%  LMP 12/05/2014 Physical Exam  Constitutional: She appears well-developed and well-nourished.  HENT:  Head: Normocephalic and atraumatic.  Eyes: Conjunctivae are normal.  Neck: Normal range of motion.  Cardiovascular: Normal rate, regular rhythm, normal heart sounds and intact distal pulses.   Pulmonary/Chest: Effort normal and breath sounds normal. She has no wheezes.  Abdominal: Soft. Bowel sounds are normal. There is no tenderness. There is no guarding and no CVA tenderness.  Mild increased pressure sensation to palpation in the suprapubic region. No guarding. Abdomen is soft. No CVA tenderness.  Musculoskeletal: Normal range of motion.  Neurological: She is alert.  Skin: Skin is warm and dry.  Psychiatric: She has a normal mood and affect.  Nursing note and vitals reviewed.   ED Course  Procedures (including critical care time)  DIAGNOSTIC STUDIES: Oxygen Saturation is 99% on RA, normal by my interpretation.    COORDINATION OF CARE: 9:20 AM - Discussed treatment plan with pt at bedside which includes diagnostic testing, and pt agreed to plan.   Labs Review Labs Reviewed  URINALYSIS, ROUTINE W REFLEX MICROSCOPIC (NOT [facility]) - Abnormal; Notable for the following:    APPearance CLOUDY (*)    Specific Gravity, Urine >1.030 (*)     Hgb urine dipstick LARGE (*)    Protein, ur >300 (*)    Nitrite POSITIVE (*)    Leukocytes, UA TRACE (*)    All other components within normal limits  URINE MICROSCOPIC-ADD ON - Abnormal; Notable for the following:    Squamous Epithelial / LPF MANY (*)    Bacteria, UA MANY (*)    All other components within normal limits  URINE CULTURE  POC URINE PREG, ED    10:30 AM - Discussed lab results with pt. Likely UTI. Will prescribe pyridium and Macrobid. Cautioned pt that her urine may change color (bright orange) as a side effect of medication. Advised pt to drink plenty of fluids. Return precautions given.  MDM   Final diagnoses:  UTI (lower urinary tract infection)   Patients labs and/or radiological studies were reviewed and considered during the medical decision making and disposition process.  Results were also discussed with patient. Urine cx ordered.  Pt placed on macrobid, pyridium.  Discussed return precautions. Prn f/u anticipated.  I personally performed the services described in this documentation, which was  scribed in my presence. The recorded information has been reviewed and is accurate.    Burgess Amor, PA-C 12/08/14 2155  Raeford Razor, MD 12/09/14 [phone]

## 2014-12-07 NOTE — ED Notes (Signed)
Pt reports thinks has UTI, has pain when she urinates since yesterday.

## 2014-12-07 NOTE — Discharge Instructions (Signed)

## 2014-12-09 LAB — URINE CULTURE

## 2014-12-10 ENCOUNTER — Telehealth (HOSPITAL_BASED_OUTPATIENT_CLINIC_OR_DEPARTMENT_OTHER): Payer: Self-pay | Admitting: Emergency Medicine

## 2014-12-10 NOTE — Telephone Encounter (Signed)
Post ED Visit - Positive Culture Follow-up  Culture report reviewed by antimicrobial stewardship pharmacist: []  Wes Dulaney, Pharm.D., BCPS []  Celedonio MiyamotoJeremy Frens, Pharm.D., BCPS []  Georgina PillionElizabeth Martin, Pharm.D., BCPS []  ArlingtonMinh Pham, [address]Ph[address]D., BCPS, AAHIVP []  Estella HuskMichelle Turner, Pharm.D., BCPS, AAHIVP []  Elder CyphersLorie Poole, [address]Ph[address]D., BCPS Lysle Pearlachel Rumbarger PharmD  Positive urine culture E. coli Treated with nitrofurantoin, organism sensitive to the same and no further patient follow-up is required at this time.  Berle MullMiller, Addiel Mccardle 12/10/2014, 11:45 AM

## 2015-02-04 ENCOUNTER — Encounter (HOSPITAL_COMMUNITY): Payer: Self-pay | Admitting: *Deleted

## 2015-02-04 ENCOUNTER — Inpatient Hospital Stay (HOSPITAL_COMMUNITY)
Admission: AD | Admit: 2015-02-04 | Discharge: 2015-02-04 | Disposition: A | Discharge status: Home / Self Care | Payer: Medicaid Other | Source: Ambulatory Visit | Attending: Obstetrics & Gynecology | Admitting: Obstetrics & Gynecology

## 2015-02-04 DIAGNOSIS — Z3201 Encounter for pregnancy test, result positive: Secondary | ICD-10-CM | POA: Insufficient documentation

## 2015-02-04 DIAGNOSIS — Z880 Allergy status to penicillin: Secondary | ICD-10-CM | POA: Diagnosis not present

## 2015-02-04 DIAGNOSIS — N898 Other specified noninflammatory disorders of vagina: Secondary | ICD-10-CM | POA: Diagnosis not present

## 2015-02-04 DIAGNOSIS — F17210 Nicotine dependence, cigarettes, uncomplicated: Secondary | ICD-10-CM | POA: Diagnosis not present

## 2015-02-04 HISTORY — DX: Unspecified abnormal cytological findings in specimens from vagina: R87.629

## 2015-02-04 LAB — WET PREP, GENITAL
Clue Cells Wet Prep HPF POC: NONE SEEN
Trich, Wet Prep: NONE SEEN
Yeast Wet Prep HPF POC: NONE SEEN

## 2015-02-04 LAB — URINALYSIS, ROUTINE W REFLEX MICROSCOPIC
Bilirubin Urine: NEGATIVE
GLUCOSE, UA: NEGATIVE mg/dL
HGB URINE DIPSTICK: NEGATIVE
KETONES UR: 15 mg/dL — AB
LEUKOCYTES UA: NEGATIVE
Nitrite: NEGATIVE
Protein, ur: NEGATIVE mg/dL
Specific Gravity, Urine: 1.025 (ref 1.005–1.030)
UROBILINOGEN UA: 0.2 mg/dL (ref 0.0–1.0)
pH: 6.5 (ref 5.0–8.0)

## 2015-02-04 LAB — POCT PREGNANCY, URINE: PREG TEST UR: POSITIVE — AB

## 2015-02-04 MED ORDER — PRENATAL VITAMINS 0.8 MG PO TABS: 1.0000 | ORAL_TABLET | Freq: Every day | ORAL | Status: DC

## 2015-02-04 NOTE — MAU Note (Signed)
Pt constantly typing on laptop and not making eye contact with RN.  Pt states to friend she is texting on computer.  Pt answers questions but seems rather busy.

## 2015-02-04 NOTE — MAU Provider Note (Signed)
History   [id number]   Chief Complaint  Patient presents with  . Possible Pregnancy  . Vaginal Discharge    HPI Donna Parks is a 29 y.o. female [id number] here here for confirmation of pregnancy.  Patient's Patient's last menstrual period was 12/29/2014.Marland Kitchen  Denies any vaginal bleeding or abdominal pain.  +white vaginal discharge, no itching or odor x one week.  All other systems within normal limits.  Uncertain regarding prenatal care provider.  Patient's last menstrual period was 12/29/2014.  OB History  Gravida Para Term Preterm AB SAB TAB Ectopic Multiple Living  3 2 2       2     # Outcome Date GA Lbr Len/2nd Weight Sex Delivery Anes PTL Lv  3 Current           2 Term           1 Term               Past Medical History  Diagnosis Date  . Asthma   . Vaginal Pap smear, abnormal     Family History  Problem Relation Age of Onset  . Cancer Other   . Diabetes Other   . Hypertension Other     Social History   Social History  . Marital Status: Single    Spouse Name: N/A  . Number of Children: N/A  . Years of Education: N/A   Social History Main Topics  . Smoking status: Current Every Day Smoker -- 0.50 packs/day    Types: Cigarettes  . Smokeless tobacco: None  . Alcohol Use: No  . Drug Use: No  . Sexual Activity: Not Currently    Birth Control/ Protection: None   Other Topics Concern  . None   Social History Narrative    Allergies  Allergen Reactions  . Penicillins Hives    No current facility-administered medications on file prior to encounter.   Current Outpatient Prescriptions on File Prior to Encounter  Medication Sig Dispense Refill  . acetaminophen (TYLENOL) 325 MG tablet Take 650 mg by mouth every 6 (six) hours as needed (pain).    Marland Kitchen albuterol (PROVENTIL HFA;VENTOLIN HFA) 108 (90 BASE) MCG/ACT inhaler Inhale 2 puffs into the lungs every 6 (six) hours as needed for wheezing or shortness of breath.     Marland Kitchen HYDROcodone-acetaminophen  (NORCO/VICODIN) 5-325 MG per tablet Take 1 tablet by mouth every 6 (six) hours as needed. (Patient not taking: Reported on 09/09/2014) 10 tablet 0  . hydrOXYzine (ATARAX/VISTARIL) 10 MG tablet Take 1 tablet (10 mg total) by mouth 3 (three) times daily as needed for itching. 20 tablet 0  . ibuprofen (ADVIL,MOTRIN) 800 MG tablet Take 1 tablet (800 mg total) by mouth every 8 (eight) hours as needed for mild pain. (Patient not taking: Reported on 09/09/2014) 30 tablet 0  . metroNIDAZOLE (FLAGYL) 500 MG tablet Take 1 tablet (500 mg total) by mouth 2 (two) times daily. Do not drink alcohol with this medication. (Patient not taking: Reported on 09/09/2014) 14 tablet 0  . nitrofurantoin, macrocrystal-monohydrate, (MACROBID) 100 MG capsule Take 1 capsule (100 mg total) by mouth 2 (two) times daily. 10 capsule 0  . phenazopyridine (PYRIDIUM) 200 MG tablet Take 1 tablet (200 mg total) by mouth 3 (three) times daily as needed for pain. 10 tablet 0  . predniSONE (DELTASONE) 20 MG tablet Take 3 tablets (60 mg total) by mouth daily. 15 tablet 0     Physical Exam   Filed Vitals:   02/04/15  2006  BP: 137/71  Pulse: 84  Temp: 98.4 F (36.9 C)  TempSrc: Oral  Resp: 20  Height: 5' 3.8" (1.621 m)  Weight: 81.285 kg (179 lb 3.2 oz)  SpO2: 100%    Physical Exam  Constitutional: She is oriented to person, place, and time. She appears well-developed and well-nourished. No distress.  HENT:  Head: Normocephalic.  Neck: Normal range of motion. Neck supple.  Cardiovascular: Normal rate, regular rhythm and normal heart sounds.  Exam reveals no gallop and no friction rub.   No murmur heard. Respiratory: Effort normal and breath sounds normal. No respiratory distress.  GI: Soft. There is no tenderness. There is no CVA tenderness.  Genitourinary: Cervix exhibits no discharge. Vaginal discharge (white, creamy) found.  Musculoskeletal: Normal range of motion.  Neurological: She is alert and oriented to person, place,  and time.  Skin: Skin is warm and dry.  Psychiatric: She has a normal mood and affect.    MAU Course  Procedures  MDM Results for orders placed or performed during the hospital encounter of 02/04/15 (from the past 24 hour(s))  Urinalysis, Routine w reflex microscopic (not at [facility])     Status: Abnormal   Collection Time: 02/04/15  8:09 PM  Result Value Ref Range   Color, Urine YELLOW YELLOW   APPearance CLEAR CLEAR   Specific Gravity, Urine 1.025 1.005 - 1.030   pH 6.5 5.0 - 8.0   Glucose, UA NEGATIVE NEGATIVE mg/dL   Hgb urine dipstick NEGATIVE NEGATIVE   Bilirubin Urine NEGATIVE NEGATIVE   Ketones, ur 15 (A) NEGATIVE mg/dL   Protein, ur NEGATIVE NEGATIVE mg/dL   Urobilinogen, UA 0.2 0.0 - 1.0 mg/dL   Nitrite NEGATIVE NEGATIVE   Leukocytes, UA NEGATIVE NEGATIVE  Pregnancy, urine POC     Status: Abnormal   Collection Time: 02/04/15  8:19 PM  Result Value Ref Range   Preg Test, Ur POSITIVE (A) NEGATIVE  Wet prep, genital     Status: Abnormal   Collection Time: 02/04/15  8:55 PM  Result Value Ref Range   Yeast Wet Prep HPF POC NONE SEEN NONE SEEN   Trich, Wet Prep NONE SEEN NONE SEEN   Clue Cells Wet Prep HPF POC NONE SEEN NONE SEEN   WBC, Wet Prep HPF POC FEW (A) NONE SEEN     Assessment and Plan  29 y.o. G3P2002 at [redacted]w[redacted]d wks Pregnancy by LMP Positive Pregnancy Test Vaginal Discharge in Pregnancy  Explained benefits of taking prenatal vitamins w/folic acid early in pregnancy. Begin prenatal care. Reviewed warning signs of pregnancy.  Marlis Edelson, CNM 02/04/2015 9:37 PM

## 2015-02-04 NOTE — MAU Note (Addendum)
Pt had two home upt's and wants to confirm pregnancy. LMP: 12-29-2014. Denies pain or bleeding. Pt also has c/o of discharge that she wants to make sure is not a yeast infection. Does not itch or have an odor.

## 2015-02-20 ENCOUNTER — Inpatient Hospital Stay (HOSPITAL_COMMUNITY): Payer: Medicaid Other

## 2015-02-20 ENCOUNTER — Inpatient Hospital Stay (HOSPITAL_COMMUNITY)
Admission: AD | Admit: 2015-02-20 | Discharge: 2015-02-20 | Disposition: A | Discharge status: Home / Self Care | Payer: Medicaid Other | Source: Ambulatory Visit | Attending: Obstetrics and Gynecology | Admitting: Obstetrics and Gynecology

## 2015-02-20 ENCOUNTER — Encounter (HOSPITAL_COMMUNITY): Payer: Self-pay | Admitting: *Deleted

## 2015-02-20 DIAGNOSIS — O99331 Smoking (tobacco) complicating pregnancy, first trimester: Secondary | ICD-10-CM | POA: Diagnosis not present

## 2015-02-20 DIAGNOSIS — O210 Mild hyperemesis gravidarum: Secondary | ICD-10-CM | POA: Insufficient documentation

## 2015-02-20 DIAGNOSIS — R519 Other specified pregnancy related conditions, first trimester: Secondary | ICD-10-CM

## 2015-02-20 DIAGNOSIS — Z3A01 Less than 8 weeks gestation of pregnancy: Secondary | ICD-10-CM | POA: Diagnosis not present

## 2015-02-20 DIAGNOSIS — O9989 Other specified diseases and conditions complicating pregnancy, childbirth and the puerperium: Secondary | ICD-10-CM | POA: Diagnosis not present

## 2015-02-20 DIAGNOSIS — Z880 Allergy status to penicillin: Secondary | ICD-10-CM | POA: Insufficient documentation

## 2015-02-20 DIAGNOSIS — R51 Headache: Secondary | ICD-10-CM | POA: Insufficient documentation

## 2015-02-20 DIAGNOSIS — R109 Unspecified abdominal pain: Secondary | ICD-10-CM | POA: Diagnosis not present

## 2015-02-20 DIAGNOSIS — O26891 Other specified pregnancy related conditions, first trimester: Secondary | ICD-10-CM | POA: Insufficient documentation

## 2015-02-20 DIAGNOSIS — F17210 Nicotine dependence, cigarettes, uncomplicated: Secondary | ICD-10-CM | POA: Diagnosis not present

## 2015-02-20 DIAGNOSIS — O219 Vomiting of pregnancy, unspecified: Secondary | ICD-10-CM

## 2015-02-20 DIAGNOSIS — Z3491 Encounter for supervision of normal pregnancy, unspecified, first trimester: Secondary | ICD-10-CM

## 2015-02-20 DIAGNOSIS — O26899 Other specified pregnancy related conditions, unspecified trimester: Secondary | ICD-10-CM

## 2015-02-20 LAB — URINALYSIS, ROUTINE W REFLEX MICROSCOPIC
Bilirubin Urine: NEGATIVE
GLUCOSE, UA: NEGATIVE mg/dL
HGB URINE DIPSTICK: NEGATIVE
Ketones, ur: NEGATIVE mg/dL
LEUKOCYTES UA: NEGATIVE
Nitrite: NEGATIVE
PH: 8.5 — AB (ref 5.0–8.0)
Protein, ur: NEGATIVE mg/dL
SPECIFIC GRAVITY, URINE: 1.015 (ref 1.005–1.030)
Urobilinogen, UA: 0.2 mg/dL (ref 0.0–1.0)

## 2015-02-20 LAB — WET PREP, GENITAL
CLUE CELLS WET PREP: NONE SEEN
TRICH WET PREP: NONE SEEN
YEAST WET PREP: NONE SEEN

## 2015-02-20 LAB — CBC
HEMATOCRIT: 36.5 % (ref 36.0–46.0)
HEMOGLOBIN: 12.8 g/dL (ref 12.0–15.0)
MCH: 32.3 pg (ref 26.0–34.0)
MCHC: 35.1 g/dL (ref 30.0–36.0)
MCV: 92.2 fL (ref 78.0–100.0)
Platelets: 266 10*3/uL (ref 150–400)
RBC: 3.96 MIL/uL (ref 3.87–5.11)
RDW: 12.7 % (ref 11.5–15.5)
WBC: 9 10*3/uL (ref 4.0–10.5)

## 2015-02-20 LAB — ABO/RH: ABO/RH(D): A POS

## 2015-02-20 LAB — HCG, QUANTITATIVE, PREGNANCY: hCG, Beta Chain, Quant, S: 110098 m[IU]/mL — ABNORMAL HIGH (ref <5)

## 2015-02-20 MED ORDER — PROMETHAZINE HCL 25 MG PO TABS: 12.5000 mg | ORAL_TABLET | Freq: Four times a day (QID) | ORAL | Status: DC | PRN

## 2015-02-20 MED ORDER — ACETAMINOPHEN 500 MG PO TABS
1000.0000 mg | ORAL_TABLET | Freq: Once | ORAL | Status: AC
  Administered 2015-02-20: 1000 mg via ORAL
  Filled 2015-02-20: qty 2

## 2015-02-20 NOTE — MAU Note (Signed)
History   Patient is 29 year old G3P2002 pregnant at [redacted]w[redacted]d presenting with lower pelvic/abdominal tenderness.  CSN: [id number]  Arrival date and time: 02/20/15 1640   First Provider Initiated Contact with Patient 02/20/15 1732      Chief Complaint  Patient presents with  . Abdominal Pain  . Nausea  . Headache   HPI  Patient reports that symptoms began early this morning shortly after waking. She describes the pain as a 7/10 pain localized to her anterior pelvic region, radiating from one side to the other. She notes that this sensation is quite different from the usual contractions she felt in her other pregnancies. Pain seems to come on by itself, intermittently during the day.  No aggravating factors noted. Not worse or better with activity. She has not tried any medication. Denies pain or abnormal coloration with urination, recent UTI, fever, or recent illness. No abnormal discharge, spotting or bleeding. Confirms nausea and vomiting this morning.   Patient has a daughter with the cold, but believes she is starting to get better and doesn't think she contracted the illness.   Past Medical History  Diagnosis Date  . Asthma   . Vaginal Pap smear, abnormal     Past Surgical History  Procedure Laterality Date  . No past surgeries      Family History  Problem Relation Age of Onset  . Cancer Other   . Diabetes Other   . Hypertension Other     Social History  Substance Use Topics  . Smoking status: Current Every Day Smoker -- 0.50 packs/day    Types: Cigarettes  . Smokeless tobacco: None  . Alcohol Use: No    Allergies:  Allergies  Allergen Reactions  . Penicillins Hives    Has patient had a PCN reaction causing immediate rash, facial/tongue/throat swelling, SOB or lightheadedness with hypotension: No Has patient had a PCN reaction causing severe rash involving mucus membranes or skin necrosis: No Has patient had a PCN reaction that required hospitalization No Has  patient had a PCN reaction occurring within the last 10 years: No If all of the above answers are "NO", then may proceed with Cephalosporin use.    Prescriptions prior to admission  Medication Sig Dispense Refill Last Dose  . albuterol (PROVENTIL HFA;VENTOLIN HFA) 108 (90 BASE) MCG/ACT inhaler Inhale 2 puffs into the lungs every 6 (six) hours as needed for wheezing or shortness of breath.    rescue  . chlorhexidine (PERIDEX) 0.12 % solution Use as directed 15 mLs in the mouth or throat 2 (two) times daily.   Past Month at Unknown time  . ibuprofen (ADVIL,MOTRIN) 200 MG tablet Take 400 mg by mouth every 6 (six) hours as needed for moderate pain (dental).   Past Month at Unknown time  . oxyCODONE-acetaminophen (PERCOCET) 10-325 MG tablet Take 1 tablet by mouth every 4 (four) hours as needed for pain.   Past Month at Unknown time  . Prenatal Multivit-Min-Fe-FA (PRENATAL VITAMINS) 0.8 MG tablet Take 1 tablet by mouth daily. 30 tablet 12 Past Week at Unknown time  . [DISCONTINUED] oxyCODONE-acetaminophen (PERCOCET/ROXICET) 5-325 MG tablet Take 1 tablet by mouth every 4 (four) hours as needed for severe pain (dental).       ROS Physical Exam   Blood pressure 119/77, pulse 71, temperature 97.8 F (36.6 C), temperature source Oral, resp. rate 16, height  (1.651 m), weight 79.833 kg (176 lb), last menstrual period 12/29/2014.  Physical Exam  Constitutional: She appears well-developed and  well-nourished.  HENT:  Head: Normocephalic and atraumatic.  Eyes: Pupils are equal, round, and reactive to light.  Neck: Normal range of motion.  Cardiovascular: Normal rate, regular rhythm and normal heart sounds.  Exam reveals no gallop and no friction rub.   No murmur heard. Respiratory: Effort normal and breath sounds normal. She has no wheezes. She has no rales.  Genitourinary: Vagina normal.  Normal cervical anatomy. No bloody discharge from cervical OS. Thick white mucus secreted from cervical os  observed.    MAU Course  Procedures   Assessment and Plan  Patient is 29 year old G3P002 at [redacted]w[redacted]d gestational age presenting with acute lower abdominal pain. Absence of evidence of spotting or excessive bleeding from history, as well as the observed closure of the cervix on pelvic exam makes spontaneous abortion less likely.   - Will need to obtain ultrasound to confirm the presence of FHS - Will obtain UA and vaginal culture to rule out infection. - Caution patient to avoid strenuous or extreme physical activity.  Randel Books MS 3 02/20/2015, 6:15 PM

## 2015-02-20 NOTE — MAU Provider Note (Signed)
Chief Complaint: Abdominal Pain; Nausea; and Headache   First Provider Initiated Contact with Patient 02/20/15 1732      SUBJECTIVE HPI: Donna Parks is a 29 y.o. G3P2002 at [redacted]w[redacted]d by LMP who presents to maternity admissions reporting onset of cramping abdominal pain earlier today.  She reports the pain is in her RLQ and LLQ, alternating between the two.  She also reports h/a with onset today. She has not taken any medications for her pain.  She does report nausea with vomiting x 1 today.  Her nausea is keeping her from drinking many fluids. She denies vaginal bleeding, vaginal itching/burning, urinary symptoms, h/a, dizziness, or fever/chills.     Abdominal Pain This is a new problem. The current episode started today. The onset quality is sudden. The problem occurs intermittently. The problem has been waxing and waning. The pain is located in the LLQ and RLQ. The pain is moderate. The quality of the pain is cramping and sharp. The abdominal pain does not radiate. Associated symptoms include headaches, nausea and vomiting. Pertinent negatives include no constipation, diarrhea, dysuria, fever or frequency. Nothing aggravates the pain. She has tried nothing for the symptoms.  Headache  This is a new problem. The current episode started today. The problem occurs constantly. The problem has been unchanged. The pain is located in the frontal region. The pain does not radiate. The pain quality is similar to prior headaches. The quality of the pain is described as dull. The pain is mild. Associated symptoms include abdominal pain, nausea and vomiting. Pertinent negatives include no dizziness, fever, neck pain, photophobia, sinus pressure or weakness. She has tried nothing for the symptoms.    Past Medical History  Diagnosis Date  . Asthma   . Vaginal Pap smear, abnormal    Past Surgical History  Procedure Laterality Date  . No past surgeries     Social History   Social History  . Marital  Status: Single    Spouse Name: N/A  . Number of Children: N/A  . Years of Education: N/A   Occupational History  . Not on file.   Social History Main Topics  . Smoking status: Current Every Day Smoker -- 0.50 packs/day    Types: Cigarettes  . Smokeless tobacco: Not on file  . Alcohol Use: No  . Drug Use: No  . Sexual Activity: Not Currently    Birth Control/ Protection: None   Other Topics Concern  . Not on file   Social History Narrative   No current facility-administered medications on file prior to encounter.   Current Outpatient Prescriptions on File Prior to Encounter  Medication Sig Dispense Refill  . albuterol (PROVENTIL HFA;VENTOLIN HFA) 108 (90 BASE) MCG/ACT inhaler Inhale 2 puffs into the lungs every 6 (six) hours as needed for wheezing or shortness of breath.     . Prenatal Multivit-Min-Fe-FA (PRENATAL VITAMINS) 0.8 MG tablet Take 1 tablet by mouth daily. 30 tablet 12   Allergies  Allergen Reactions  . Penicillins Hives    Has patient had a PCN reaction causing immediate rash, facial/tongue/throat swelling, SOB or lightheadedness with hypotension: No Has patient had a PCN reaction causing severe rash involving mucus membranes or skin necrosis: No Has patient had a PCN reaction that required hospitalization No Has patient had a PCN reaction occurring within the last 10 years: No If all of the above answers are "NO", then may proceed with Cephalosporin use.    ROS:  Review of Systems  Constitutional: Negative for  fever, chills and fatigue.  HENT: Negative for sinus pressure.   Eyes: Negative for photophobia.  Respiratory: Negative for shortness of breath.   Cardiovascular: Negative for chest pain.  Gastrointestinal: Positive for nausea, vomiting and abdominal pain. Negative for diarrhea and constipation.  Genitourinary: Negative for dysuria, frequency, flank pain, vaginal bleeding, vaginal discharge, difficulty urinating, vaginal pain and pelvic pain.   Musculoskeletal: Negative for neck pain.  Neurological: Positive for headaches. Negative for dizziness and weakness.  Psychiatric/Behavioral: Negative.      I have reviewed patient's Past Medical Hx, Surgical Hx, Family Hx, Social Hx, medications and allergies.   Physical Exam   Patient Vitals for the past 24 hrs:  BP Temp Temp src Pulse Resp Height Weight  02/20/15 2028 118/69 mmHg - - 81 18 - -  02/20/15 1651 119/77 mmHg 97.8 F (36.6 C) Oral 71 16  (1.651 m) 79.833 kg (176 lb)   Constitutional: Well-developed, well-nourished female in no acute distress.  Cardiovascular: normal rate Respiratory: normal effort GI: Abd soft, non-tender. Pos BS x 4 MS: Extremities nontender, no edema, normal ROM Neurologic: Alert and oriented x 4.  GU: Neg CVAT.  PELVIC EXAM: Cervix pink, visually closed, without lesion, scant white creamy discharge, vaginal walls and external genitalia normal Bimanual exam: Cervix 0/long/high, firm, anterior, neg CMT, uterus nontender, nonenlarged, adnexa without tenderness, enlargement, or mass   LAB RESULTS Results for orders placed or performed during the hospital encounter of 02/20/15 (from the past 24 hour(s))  Urinalysis, Routine w reflex microscopic (not at [facility])     Status: Abnormal   Collection Time: 02/20/15  5:00 PM  Result Value Ref Range   Color, Urine YELLOW YELLOW   APPearance HAZY (A) CLEAR   Specific Gravity, Urine 1.015 1.005 - 1.030   pH 8.5 (H) 5.0 - 8.0   Glucose, UA NEGATIVE NEGATIVE mg/dL   Hgb urine dipstick NEGATIVE NEGATIVE   Bilirubin Urine NEGATIVE NEGATIVE   Ketones, ur NEGATIVE NEGATIVE mg/dL   Protein, ur NEGATIVE NEGATIVE mg/dL   Urobilinogen, UA 0.2 0.0 - 1.0 mg/dL   Nitrite NEGATIVE NEGATIVE   Leukocytes, UA NEGATIVE NEGATIVE  CBC     Status: None   Collection Time: 02/20/15  5:43 PM  Result Value Ref Range   WBC 9.0 4.0 - 10.5 K/uL   RBC 3.96 3.87 - 5.11 MIL/uL   Hemoglobin 12.8 12.0 - 15.0 g/dL   HCT 24.4  01.0 - 27.2 %   MCV 92.2 78.0 - 100.0 fL   MCH 32.3 26.0 - 34.0 pg   MCHC 35.1 30.0 - 36.0 g/dL   RDW 53.6 64.4 - 03.4 %   Platelets 266 150 - 400 K/uL  hCG, quantitative, pregnancy     Status: Abnormal   Collection Time: 02/20/15  5:43 PM  Result Value Ref Range   hCG, Beta Chain, Quant, S 110098 (H) <5 mIU/mL  ABO/Rh     Status: None   Collection Time: 02/20/15  5:43 PM  Result Value Ref Range   ABO/RH(D) A POS   Wet prep, genital     Status: Abnormal   Collection Time: 02/20/15  5:50 PM  Result Value Ref Range   Yeast Wet Prep HPF POC NONE SEEN NONE SEEN   Trich, Wet Prep NONE SEEN NONE SEEN   Clue Cells Wet Prep HPF POC NONE SEEN NONE SEEN   WBC, Wet Prep HPF POC FEW (A) NONE SEEN    --/--/A POS (09/27 1743)  IMAGING US Ob Comp  Less 14 Wks  02/20/2015   CLINICAL DATA:  Right-sided lower abdominal/ pelvic pain  EXAM: OBSTETRIC <14 WK [place] AND TRANSVAGINAL OB US  TECHNIQUE: Both transabdominal and transvaginal ultrasound examinations were performed for complete evaluation of the gestation as well as the maternal uterus, adnexal regions, and pelvic cul-de-sac. Transvaginal technique was performed to assess early pregnancy.  COMPARISON:  None.  FINDINGS: Intrauterine gestational sac: Visualized/normal in shape.  Yolk sac:  Visualized  Embryo:  Visualized  Cardiac Activity: Visualized  Heart Rate: 137  bpm  CRL:  13  mm   7 w   4 d                  [place] [facility]: Oct 05, 2015  Maternal uterus/adnexae: There is a subchorionic hemorrhage measuring 1.7 x 1.8 cm. Cervical os is closed. There are no maternal extrauterine pelvic or adnexal masses. No free pelvic fluid.  IMPRESSION: Single live intrauterine gestation with estimated gestational age of 7+ weeks. Small subchorionic hemorrhage. No extrauterine pelvic mass or fluid. Cervical os appears closed.   Electronically Signed   By: Bretta Bang III M.D.   On: 02/20/2015 20:12   US Ob Transvaginal  02/20/2015   CLINICAL DATA:  Right-sided lower  abdominal/ pelvic pain  EXAM: OBSTETRIC <14 WK [place] AND TRANSVAGINAL OB US  TECHNIQUE: Both transabdominal and transvaginal ultrasound examinations were performed for complete evaluation of the gestation as well as the maternal uterus, adnexal regions, and pelvic cul-de-sac. Transvaginal technique was performed to assess early pregnancy.  COMPARISON:  None.  FINDINGS: Intrauterine gestational sac: Visualized/normal in shape.  Yolk sac:  Visualized  Embryo:  Visualized  Cardiac Activity: Visualized  Heart Rate: 137  bpm  CRL:  13  mm   7 w   4 d                  [place] [facility]: Oct 05, 2015  Maternal uterus/adnexae: There is a subchorionic hemorrhage measuring 1.7 x 1.8 cm. Cervical os is closed. There are no maternal extrauterine pelvic or adnexal masses. No free pelvic fluid.  IMPRESSION: Single live intrauterine gestation with estimated gestational age of 7+ weeks. Small subchorionic hemorrhage. No extrauterine pelvic mass or fluid. Cervical os appears closed.   Electronically Signed   By: Bretta Bang III M.D.   On: 02/20/2015 20:12    MAU Management/MDM: Ordered labs and reviewed results. Treatments in MAU included Tylenol 1000 mg PO with significant reduction in pain. Pt stable at time of discharge.  ASSESSMENT 1. Normal IUP (intrauterine pregnancy) on prenatal ultrasound, first trimester   2. Abdominal pain affecting pregnancy   3. Headache in pregnancy, antepartum, first trimester   4. Nausea and vomiting during pregnancy prior to [redacted] weeks gestation     PLAN Discharge home Phenergan 12.5-25 mg PO Q 6 hours PRN Discussed OTC medications for nausea including Unisom/B6 Increase PO fluids F/U with Nestor Ramp as scheduled in October   Follow-up Information    Please follow up.   Why:  Start prenatal care as soon as possible      Follow up with [facility] [facility].   Why:  As needed for emergencies   Contact information:   [address] [id number] mc [place] [address] [phone]      Sharen Counter Certified Nurse-Midwife 02/20/2015  8:33 PM

## 2015-02-20 NOTE — Discharge Instructions (Signed)
Nausea medication to take during pregnancy:  ° °Unisom (doxylamine succinate 25 mg tablets) Take one tablet daily at bedtime. If symptoms are not adequately controlled, the dose can be increased to a maximum recommended dose of two tablets daily (1/2 tablet in the morning, 1/2 tablet mid-afternoon and one at bedtime). ° °Vitamin B6 100mg tablets. Take one tablet twice a day (up to 200 mg per day). ° °Add Phenergan as prescribed to take as needed.  ° °Abdominal Pain During Pregnancy °Abdominal pain is common in pregnancy. Most of the time, it does not cause harm. There are many causes of abdominal pain. Some causes are more serious than others. Some of the causes of abdominal pain in pregnancy are easily diagnosed. Occasionally, the diagnosis takes time to understand. Other times, the cause is not determined. Abdominal pain can be a sign that something is very wrong with the pregnancy, or the pain may have nothing to do with the pregnancy at all. For this reason, always tell your health care provider if you have any abdominal discomfort. °HOME CARE INSTRUCTIONS  °Monitor your abdominal pain for any changes. The following actions may help to alleviate any discomfort you are experiencing: °· Do not have sexual intercourse or put anything in your vagina until your symptoms go away completely. °· Get plenty of rest until your pain improves. °· Drink clear fluids if you feel nauseous. Avoid solid food as long as you are uncomfortable or nauseous. °· Only take over-the-counter or prescription medicine as directed by your health care provider. °· Keep all follow-up appointments with your health care provider. °SEEK IMMEDIATE MEDICAL CARE IF: °· You are bleeding, leaking fluid, or passing tissue from the vagina. °· You have increasing pain or cramping. °· You have persistent vomiting. °· You have painful or bloody urination. °· You have a fever. °· You notice a decrease in your baby's movements. °· You have extreme weakness  or feel faint. °· You have shortness of breath, with or without abdominal pain. °· You develop a severe headache with abdominal pain. °· You have abnormal vaginal discharge with abdominal pain. °· You have persistent diarrhea. °· You have abdominal pain that continues even after rest, or gets worse. °MAKE SURE YOU:  °· Understand these instructions. °· Will watch your condition. °· Will get help right away if you are not doing well or get worse. °Document Released: 05/12/2005 Document Revised: 03/02/2013 Document Reviewed: 12/09/2012 °ExitCare® Patient Information ©2015 [employer]. This information is not intended to replace advice given to you by your health care provider. Make sure you discuss any questions you have with your health care provider. °Morning Sickness °Morning sickness is when you feel sick to your stomach (nauseous) during pregnancy. This nauseous feeling may or may not come with vomiting. It often occurs in the morning but can be a problem any time of day. Morning sickness is most common during the first trimester, but it may continue throughout pregnancy. While morning sickness is unpleasant, it is usually harmless unless you develop severe and continual vomiting (hyperemesis gravidarum). This condition requires more intense treatment.  °CAUSES  °The cause of morning sickness is not completely known but seems to be related to normal hormonal changes that occur in pregnancy. °RISK FACTORS °You are at greater risk if you: °· Experienced nausea or vomiting before your pregnancy. °· Had morning sickness during a previous pregnancy. °· Are pregnant with more than one baby, such as twins. °TREATMENT  °Do not use any medicines (prescription,   over-the-counter, or herbal) for morning sickness without first talking to your health care provider. Your health care provider may prescribe or recommend: °· Vitamin B6 supplements. °· Anti-nausea medicines. °· The herbal medicine ginger. °HOME CARE INSTRUCTIONS    °· Only take over-the-counter or prescription medicines as directed by your health care provider. °· Taking multivitamins before getting pregnant can prevent or decrease the severity of morning sickness in most women. °· Eat a piece of dry toast or unsalted crackers before getting out of bed in the morning. °· Eat five or six small meals a day. °· Eat dry and bland foods (rice, baked potato). Foods high in carbohydrates are often helpful. °· Do not drink liquids with your meals. Drink liquids between meals. °· Avoid greasy, fatty, and spicy foods. °· Get someone to cook for you if the smell of any food causes nausea and vomiting. °· If you feel nauseous after taking prenatal vitamins, take the vitamins at night or with a snack.  °· Snack on protein foods (nuts, yogurt, cheese) between meals if you are hungry. °· Eat unsweetened gelatins for desserts. °· Wearing an acupressure wristband (worn for sea sickness) may be helpful. °· Acupuncture may be helpful. °· Do not smoke. °· Get a humidifier to keep the air in your house free of odors. °· Get plenty of fresh air. °SEEK MEDICAL CARE IF:  °· Your home remedies are not working, and you need medicine. °· You feel dizzy or lightheaded. °· You are losing weight. °SEEK IMMEDIATE MEDICAL CARE IF:  °· You have persistent and uncontrolled nausea and vomiting. °· You pass out (faint). °MAKE SURE YOU: °· Understand these instructions. °· Will watch your condition. °· Will get help right away if you are not doing well or get worse. °Document Released: 07/03/2006 Document Revised: 05/17/2013 Document Reviewed: 10/27/2012 °ExitCare® Patient Information ©2015 [employer]. This information is not intended to replace advice given to you by your health care provider. Make sure you discuss any questions you have with your health care provider. ° °

## 2015-02-20 NOTE — MAU Note (Signed)
Been having pain in lower abd, sometimes rt side, sometimes left.  Sometimes sharp,sometimes dull. Has been nauseated, and has headache.

## 2015-02-21 LAB — HIV ANTIBODY (ROUTINE TESTING W REFLEX): HIV SCREEN 4TH GENERATION: NONREACTIVE

## 2015-02-21 LAB — GC/CHLAMYDIA PROBE AMP (~~LOC~~) NOT AT ARMC
CHLAMYDIA, DNA PROBE: NEGATIVE
Neisseria Gonorrhea: NEGATIVE

## 2015-03-02 ENCOUNTER — Other Ambulatory Visit: Payer: Self-pay | Admitting: Obstetrics and Gynecology

## 2015-05-05 ENCOUNTER — Inpatient Hospital Stay (HOSPITAL_COMMUNITY)
Admission: AD | Admit: 2015-05-05 | Discharge: 2015-05-06 | Disposition: A | Discharge status: Home / Self Care | Payer: Medicaid Other | Source: Ambulatory Visit | Attending: Obstetrics and Gynecology | Admitting: Obstetrics and Gynecology

## 2015-05-05 DIAGNOSIS — E860 Dehydration: Secondary | ICD-10-CM | POA: Diagnosis not present

## 2015-05-05 DIAGNOSIS — Z87891 Personal history of nicotine dependence: Secondary | ICD-10-CM | POA: Diagnosis not present

## 2015-05-05 DIAGNOSIS — R5383 Other fatigue: Secondary | ICD-10-CM | POA: Diagnosis not present

## 2015-05-05 DIAGNOSIS — Z880 Allergy status to penicillin: Secondary | ICD-10-CM | POA: Insufficient documentation

## 2015-05-05 DIAGNOSIS — O26892 Other specified pregnancy related conditions, second trimester: Secondary | ICD-10-CM | POA: Insufficient documentation

## 2015-05-05 DIAGNOSIS — R42 Dizziness and giddiness: Secondary | ICD-10-CM | POA: Diagnosis not present

## 2015-05-05 DIAGNOSIS — Z3A18 18 weeks gestation of pregnancy: Secondary | ICD-10-CM | POA: Diagnosis not present

## 2015-05-05 NOTE — MAU Note (Signed)
[facility] around and felt tired. Got in line and standing, and suddenly got weak and dizzy. Went home and ate. Don't feel dizzy now. I don't feel 100% but do feel better than i did

## 2015-05-06 ENCOUNTER — Encounter (HOSPITAL_COMMUNITY): Payer: Self-pay | Admitting: *Deleted

## 2015-05-06 DIAGNOSIS — E860 Dehydration: Secondary | ICD-10-CM | POA: Diagnosis not present

## 2015-05-06 DIAGNOSIS — R42 Dizziness and giddiness: Secondary | ICD-10-CM | POA: Diagnosis not present

## 2015-05-06 LAB — COMPREHENSIVE METABOLIC PANEL
ALBUMIN: 3.2 g/dL — AB (ref 3.5–5.0)
ALT: 45 U/L (ref 14–54)
AST: 32 U/L (ref 15–41)
Alkaline Phosphatase: 45 U/L (ref 38–126)
Anion gap: 7 (ref 5–15)
BILIRUBIN TOTAL: 0.4 mg/dL (ref 0.3–1.2)
BUN: 12 mg/dL (ref 6–20)
CHLORIDE: 103 mmol/L (ref 101–111)
CO2: 23 mmol/L (ref 22–32)
CREATININE: 0.62 mg/dL (ref 0.44–1.00)
Calcium: 8.9 mg/dL (ref 8.9–10.3)
GFR calc Af Amer: >60 mL/min (ref >60)
GLUCOSE: 89 mg/dL (ref 65–99)
POTASSIUM: 3.5 mmol/L (ref 3.5–5.1)
Sodium: 133 mmol/L — ABNORMAL LOW (ref 135–145)
Total Protein: 6.2 g/dL — ABNORMAL LOW (ref 6.5–8.1)

## 2015-05-06 LAB — CBC
HCT: 32.9 % — ABNORMAL LOW (ref 36.0–46.0)
HEMOGLOBIN: 11.5 g/dL — AB (ref 12.0–15.0)
MCH: 31.8 pg (ref 26.0–34.0)
MCHC: 35 g/dL (ref 30.0–36.0)
MCV: 90.9 fL (ref 78.0–100.0)
PLATELETS: 261 10*3/uL (ref 150–400)
RBC: 3.62 MIL/uL — AB (ref 3.87–5.11)
RDW: 13.1 % (ref 11.5–15.5)
WBC: 9.1 10*3/uL (ref 4.0–10.5)

## 2015-05-06 LAB — URINALYSIS, ROUTINE W REFLEX MICROSCOPIC
Bilirubin Urine: NEGATIVE
Glucose, UA: NEGATIVE mg/dL
Hgb urine dipstick: NEGATIVE
Ketones, ur: 15 mg/dL — AB
LEUKOCYTES UA: NEGATIVE
Nitrite: NEGATIVE
PH: 6 (ref 5.0–8.0)
Protein, ur: NEGATIVE mg/dL

## 2015-05-06 LAB — TSH: TSH: 0.55 u[IU]/mL (ref 0.350–4.500)

## 2015-05-06 NOTE — Progress Notes (Signed)
Dr NEwton in earlier to discuss test results and d/c plan. Written and verbal d/c instructions given and understanding voiced 

## 2015-05-06 NOTE — MAU Provider Note (Signed)
History     CSN: [id number] Arrival date and time: 05/05/15 2329 First Provider Initiated Contact with Patient 05/06/15 0036      Chief Complaint  Patient presents with  . Fatigue  . Dizziness   HPI Patient is 29 y.o. [id number] [redacted]w[redacted]d here with complaints of fatigue and dizziness. She reports she was in line at [facility] when she began to feel extreme fatigue and her whole body had a "weird feeling."  She has never felt this before.  The weird feeling resolved after 5 minutes. She did not feel like the room was spinning.  She reports she paid and went to her car.  She does endorse poor PO intake and had not eaten all day.  She ate when she got back home from [employer].   No fevers, chills.  No changes in SOB. Denies any abnormal movements.  Reports HA throughout this pregnancy. Denies anemia in this pregnancy. No history of thyroid problem.   +FM, denies LOF, VB, contractions, vaginal discharge.   OB History    Gravida Para Term Preterm AB TAB SAB Ectopic Multiple Living   Past Medical History  Diagnosis Date  . Asthma   . Vaginal Pap smear, abnormal     Past Surgical History  Procedure Laterality Date  . No past surgeries      Family History  Problem Relation Age of Onset  . Cancer Other   . Diabetes Other   . Hypertension Other     Social History  Substance Use Topics  . Smoking status: Former Smoker -- 0.50 packs/day    Types: Cigarettes  . Smokeless tobacco: None  . Alcohol Use: No    Allergies:  Allergies  Allergen Reactions  . Penicillins Hives    Has patient had a PCN reaction causing immediate rash, facial/tongue/throat swelling, SOB or lightheadedness with hypotension: No Has patient had a PCN reaction causing severe rash involving mucus membranes or skin necrosis: No Has patient had a PCN reaction that required hospitalization No Has patient had a PCN reaction occurring within the last 10 years: No If all of the above answers are  "NO", then may proceed with Cephalosporin use.    Prescriptions prior to admission  Medication Sig Dispense Refill Last Dose  . albuterol (PROVENTIL HFA;VENTOLIN HFA) 108 (90 BASE) MCG/ACT inhaler Inhale 2 puffs into the lungs every 6 (six) hours as needed for wheezing or shortness of breath.    Past Month at Unknown time  . chlorhexidine (PERIDEX) 0.12 % solution Use as directed 15 mLs in the mouth or throat 2 (two) times daily.   Past Month at Unknown time  . Prenatal Multivit-Min-Fe-FA (PRENATAL VITAMINS) 0.8 MG tablet Take 1 tablet by mouth daily. 30 tablet 12 More than a month at Unknown time  . promethazine (PHENERGAN) 25 MG tablet Take 0.5-1 tablets (12.5-25 mg total) by mouth every 6 (six) hours as needed for nausea. 30 tablet 0     Review of Systems  Constitutional: Negative for fever and chills.  Eyes: Negative for blurred vision and double vision.  Respiratory: Negative for cough and shortness of breath.   Cardiovascular: Negative for chest pain and orthopnea.  Gastrointestinal: Negative for nausea and vomiting.  Genitourinary: Negative for dysuria, frequency and flank pain.  Musculoskeletal: Negative for myalgias.  Skin: Negative for rash.  Neurological: Negative for dizziness, tingling, weakness and headaches.  Endo/Heme/Allergies: Does not bruise/bleed easily.  Psychiatric/Behavioral: Negative for depression and suicidal ideas. The patient is not nervous/anxious.    Physical Exam   Blood pressure 124/66, pulse 70, resp. rate 18, height 5' 4.5" (1.638 m), weight 176 lb 9.6 oz (80.105 kg), last menstrual period 12/29/2014, SpO2 99 %.  Physical Exam  Nursing note and vitals reviewed. Constitutional: She is oriented to person, place, and time. She appears well-developed and well-nourished. No distress.  Pregnant female  HENT:  Head: Normocephalic and atraumatic.  Eyes: Conjunctivae are normal. No scleral icterus.  Neck: Normal range of motion. Neck supple.   Cardiovascular: Normal rate and intact distal pulses.   Respiratory: Effort normal. She exhibits no tenderness.  GI: Soft. There is no tenderness. There is no rebound and no guarding.  Gravid  Genitourinary: Vagina normal.  Musculoskeletal: Normal range of motion. She exhibits no edema.  Neurological: She is alert and oriented to person, place, and time. She has normal reflexes. She displays normal reflexes. No cranial nerve deficit. She exhibits normal muscle tone. Coordination normal.  5/5 strength tested at hip flexion, knee flex/ext, dorsi/plantar flexion, bicep, deltoid and grip.  Sensation intact bilaterally  Skin: Skin is warm and dry. No rash noted.  Psychiatric: She has a normal mood and affect.    MAU Course  Procedures  MDM- likely dehydration, unlikely anemia, unlikely thyroid related. Could be related to glucose regulation.  UA- s/f keytones CBC- mild anemia (hgb 11.5) CMP- wnl TSH- pending  Orthostatic VS s/f increase of 20 beats per minute from sitting to standing  Assessment and Plan  Donna Parks is a 29 y.o. G3P2002 at 465w2d presenting with vague sx consistent with dehydration especially given ketones and positive orthostatic vital signs.  - Provided PO fluid in MAU - Discussed small frequent meals - Recommended increased fluid intake.  - [facility] , next scheduled appt is 12/22  Isa RankinKimberly Niles [facility] 05/06/2015, 12:36 AM

## 2015-05-06 NOTE — Discharge Instructions (Signed)

## 2015-05-27 NOTE — L&D Delivery Note (Signed)
Patient was C/C/+3 and pushed for 5 minutes with epidural.   NSVD  female infant, Apgars 9.9, weight P.   The patient had no lacerations. Fundus was firm. EBL was expected amount. Placenta was delivered intact. Vagina was clear.  Baby was vigorous and doing skin to skin with mother.  Frances Ambrosino A

## 2015-05-31 ENCOUNTER — Inpatient Hospital Stay (HOSPITAL_COMMUNITY)
Admission: AD | Admit: 2015-05-31 | Discharge: 2015-06-01 | Disposition: A | Discharge status: Home / Self Care | Payer: Medicaid Other | Source: Ambulatory Visit | Attending: Obstetrics | Admitting: Obstetrics

## 2015-05-31 ENCOUNTER — Encounter (HOSPITAL_COMMUNITY): Payer: Self-pay | Admitting: *Deleted

## 2015-05-31 DIAGNOSIS — O9989 Other specified diseases and conditions complicating pregnancy, childbirth and the puerperium: Secondary | ICD-10-CM | POA: Diagnosis not present

## 2015-05-31 DIAGNOSIS — Z880 Allergy status to penicillin: Secondary | ICD-10-CM | POA: Diagnosis not present

## 2015-05-31 DIAGNOSIS — Z3A22 22 weeks gestation of pregnancy: Secondary | ICD-10-CM | POA: Diagnosis not present

## 2015-05-31 DIAGNOSIS — R05 Cough: Secondary | ICD-10-CM | POA: Diagnosis not present

## 2015-05-31 DIAGNOSIS — J45909 Unspecified asthma, uncomplicated: Secondary | ICD-10-CM | POA: Insufficient documentation

## 2015-05-31 DIAGNOSIS — R0981 Nasal congestion: Secondary | ICD-10-CM | POA: Diagnosis not present

## 2015-05-31 DIAGNOSIS — R51 Headache: Secondary | ICD-10-CM | POA: Insufficient documentation

## 2015-05-31 DIAGNOSIS — Z87891 Personal history of nicotine dependence: Secondary | ICD-10-CM | POA: Insufficient documentation

## 2015-05-31 DIAGNOSIS — J069 Acute upper respiratory infection, unspecified: Secondary | ICD-10-CM | POA: Insufficient documentation

## 2015-05-31 NOTE — MAU Note (Signed)
PT SAYS  SHE HAS  SINUS CONGESTION - STARTED   ON Tuesday    .  FEELS  CONGESTION  IN UPPER  CHEST.  NO  SORE THROAT .     NO FEVER  AT  HOME.     NO V/D-   BUT  FEELS  NAUSEATED   .     [facility]- WITH  [facility].        SHE  DID  NOT   CALL OFFICE.

## 2015-06-01 DIAGNOSIS — J069 Acute upper respiratory infection, unspecified: Secondary | ICD-10-CM

## 2015-06-01 DIAGNOSIS — O9989 Other specified diseases and conditions complicating pregnancy, childbirth and the puerperium: Secondary | ICD-10-CM | POA: Diagnosis not present

## 2015-06-01 LAB — URINALYSIS, ROUTINE W REFLEX MICROSCOPIC
Bilirubin Urine: NEGATIVE
Glucose, UA: NEGATIVE mg/dL
Hgb urine dipstick: NEGATIVE
Ketones, ur: NEGATIVE mg/dL
LEUKOCYTES UA: NEGATIVE
NITRITE: NEGATIVE
Protein, ur: NEGATIVE mg/dL
SPECIFIC GRAVITY, URINE: 1.025 (ref 1.005–1.030)
pH: 6 (ref 5.0–8.0)

## 2015-06-01 MED ORDER — GUAIFENESIN 100 MG/5ML PO SOLN
5.0000 mL | ORAL | Status: DC | PRN
  Administered 2015-06-01: 100 mg via ORAL
  Filled 2015-06-01: qty 15

## 2015-06-01 MED ORDER — ACETAMINOPHEN 500 MG PO TABS
1000.0000 mg | ORAL_TABLET | Freq: Once | ORAL | Status: AC
  Administered 2015-06-01: 1000 mg via ORAL
  Filled 2015-06-01: qty 2

## 2015-06-01 MED ORDER — SIMETHICONE 80 MG PO CHEW
80.0000 mg | CHEWABLE_TABLET | Freq: Once | ORAL | Status: DC
  Filled 2015-06-01: qty 1

## 2015-06-01 NOTE — MAU Provider Note (Signed)
History     CSN: [id number]  Arrival date and time: 05/31/15 2326   First Provider Initiated Contact with Patient 06/01/15 0018      Chief Complaint  Patient presents with  . Nasal Congestion   HPI Comments: Donna Parks is a 30 y.o. G3P2002 at [redacted]w[redacted]d who presents today with cold symptoms. She states that they started about two days ago. She has taken anything. She has not called the doctor's office. She denies any abdominal pain, vaginal bleeding or LOF. She has been feeling the baby move. She denies any sick contacts or known exposure to the flu. She did not have a flu shot. She uses albuterol occasionally, but has not had to use it since developing these cold symptoms. She has an inhaler at home.   URI  This is a new problem. The current episode started yesterday. The problem has been unchanged. There has been no fever. Associated symptoms include congestion, coughing, headaches and sneezing. Pertinent negatives include no abdominal pain, ear pain, nausea, sinus pain, sore throat or vomiting. She has tried nothing for the symptoms.    Past Medical History  Diagnosis Date  . Asthma   . Vaginal Pap smear, abnormal     Past Surgical History  Procedure Laterality Date  . No past surgeries      Family History  Problem Relation Age of Onset  . Cancer Other   . Diabetes Other   . Hypertension Other     Social History  Substance Use Topics  . Smoking status: Former Smoker -- 0.50 packs/day    Types: Cigarettes  . Smokeless tobacco: None  . Alcohol Use: No    Allergies:  Allergies  Allergen Reactions  . Penicillins Hives    Has patient had a PCN reaction causing immediate rash, facial/tongue/throat swelling, SOB or lightheadedness with hypotension: No Has patient had a PCN reaction causing severe rash involving mucus membranes or skin necrosis: No Has patient had a PCN reaction that required hospitalization No Has patient had a PCN reaction occurring within the last  10 years: No If all of the above answers are "NO", then may proceed with Cephalosporin use.    Prescriptions prior to admission  Medication Sig Dispense Refill Last Dose  . albuterol (PROVENTIL HFA;VENTOLIN HFA) 108 (90 BASE) MCG/ACT inhaler Inhale 2 puffs into the lungs every 6 (six) hours as needed for wheezing or shortness of breath.    Past Month at Unknown time  . Prenatal Multivit-Min-Fe-FA (PRENATAL VITAMINS) 0.8 MG tablet Take 1 tablet by mouth daily. 30 tablet 12 More than a month at Unknown time  . promethazine (PHENERGAN) 25 MG tablet Take 0.5-1 tablets (12.5-25 mg total) by mouth every 6 (six) hours as needed for nausea. 30 tablet 0     Review of Systems  Constitutional: Negative for fever and chills.  HENT: Positive for congestion and sneezing. Negative for ear pain and sore throat.   Respiratory: Positive for cough.   Gastrointestinal: Negative for nausea, vomiting and abdominal pain.  Neurological: Positive for headaches.   Physical Exam   Blood pressure 114/58, pulse 84, temperature 98.7 F (37.1 C), temperature source Oral, resp. rate 20, height 5\' 4"  (1.626 m), weight 81.761 kg (180 lb 4 oz), last menstrual period 12/29/2014.  Physical Exam  Nursing note and vitals reviewed. Constitutional: She is oriented to person, place, and time. She appears well-developed and well-nourished. No distress.  HENT:  Head: Normocephalic.  Cardiovascular: Normal rate, regular rhythm and normal heart  sounds.   Respiratory: Effort normal and breath sounds normal. No respiratory distress. She has no wheezes. She has no rales. She exhibits no tenderness.  GI: Soft. There is no tenderness. There is no rebound.  Neurological: She is alert and oriented to person, place, and time.  Skin: Skin is warm and dry.  Psychiatric: She has a normal mood and affect.    MAU Course  Procedures  MDM Patient has had mucinex and tylenol   Assessment and Plan   1. Viral URI    DC home Comfort  measures reviewed  2nd Trimester precautions  Safe medications in pregnancy reviewed  PTL precautions  Fetal kick counts RX: none  Return to MAU as needed FU with OB as planned  Follow-up Information    [facility]   Specialty:  Obstetrics   Why:  As scheduled   Contact information:   [address] [place] [address] [phone]        Tawnya CrookHogan, [place] Reta Donovan 06/01/2015, 12:20 AM

## 2015-06-01 NOTE — Discharge Instructions (Signed)
Safe Medications in Pregnancy   Acne: Benzoyl Peroxide Salicylic Acid  Backache/Headache: Tylenol: 2 regular strength every 4 hours OR              2 Extra strength every 6 hours  Colds/Coughs/Allergies: Benadryl (alcohol free) 25 mg every 6 hours as needed Breath right strips Claritin Cepacol throat lozenges Chloraseptic throat spray Cold-Eeze- up to three times per day Cough drops, alcohol free Flonase (by prescription only) Guaifenesin Mucinex Robitussin DM (plain only, alcohol free) Saline nasal spray/drops Sudafed (pseudoephedrine) & Actifed ** use only after [redacted] weeks gestation and if you do not have high blood pressure Tylenol Vicks Vaporub Zinc lozenges Zyrtec   Constipation: Colace Ducolax suppositories Fleet enema Glycerin suppositories Metamucil Milk of magnesia Miralax Senokot Smooth move tea  Diarrhea: Kaopectate Imodium A-D  *NO pepto Bismol  Hemorrhoids: Anusol Anusol HC Preparation H Tucks  Indigestion: Tums Maalox Mylanta Zantac  Pepcid  Insomnia: Benadryl (alcohol free) 25mg  every 6 hours as needed Tylenol PM Unisom, no Gelcaps  Leg Cramps: Tums MagGel  Nausea/Vomiting:  Bonine Dramamine Emetrol Ginger extract Sea bands Meclizine  Nausea medication to take during pregnancy:  Unisom (doxylamine succinate 25 mg tablets) Take one tablet daily at bedtime. If symptoms are not adequately controlled, the dose can be increased to a maximum recommended dose of two tablets daily (1/2 tablet in the morning, 1/2 tablet mid-afternoon and one at bedtime). Vitamin B6 100mg  tablets. Take one tablet twice a day (up to 200 mg per day).  Skin Rashes: Aveeno products Benadryl cream or 25mg  every 6 hours as needed Calamine Lotion 1% cortisone cream  Yeast infection: Gyne-lotrimin 7 Monistat 7   **If taking multiple medications, please check labels to avoid duplicating the same active ingredients **take medication as directed on  the label ** Do not exceed 4000 mg of tylenol in 24 hours **Do not take medications that contain aspirin or ibuprofen      Upper Respiratory Infection, Adult Most upper respiratory infections (URIs) are caused by a virus. A URI affects the nose, throat, and upper air passages. The most common type of URI is often called "the common cold." HOME CARE   Take medicines only as told by your doctor.  Gargle warm saltwater or take cough drops to comfort your throat as told by your doctor.  Use a warm mist humidifier or inhale steam from a shower to increase air moisture. This may make it easier to breathe.  Drink enough fluid to keep your pee (urine) clear or pale yellow.  Eat soups and other clear broths.  Have a healthy diet.  Rest as needed.  Go back to work when your fever is gone or your doctor says it is okay.  You may need to stay home longer to avoid giving your URI to others.  You can also wear a face mask and wash your hands often to prevent spread of the virus.  Use your inhaler more if you have asthma.  Do not use any tobacco products, including cigarettes, chewing tobacco, or electronic cigarettes. If you need help quitting, ask your doctor. GET HELP IF:  You are getting worse, not better.  Your symptoms are not helped by medicine.  You have chills.  You are getting more short of breath.  You have brown or red mucus.  You have yellow or brown discharge from your nose.  You have pain in your face, especially when you bend forward.  You have a fever.  You have puffy (  swollen) neck glands.  You have pain while swallowing.  You have white areas in the back of your throat. GET HELP RIGHT AWAY IF:   You have very bad or constant:  Headache.  Ear pain.  Pain in your forehead, behind your eyes, and over your cheekbones (sinus pain).  Chest pain.  You have long-lasting (chronic) lung disease and any of the following:  Wheezing.  Long-lasting  cough.  Coughing up blood.  A change in your usual mucus.  You have a stiff neck.  You have changes in your:  Vision.  Hearing.  Thinking.  Mood. MAKE SURE YOU:   Understand these instructions.  Will watch your condition.  Will get help right away if you are not doing well or get worse.   This information is not intended to replace advice given to you by your health care provider. Make sure you discuss any questions you have with your health care provider.   Document Released: 10/29/2007 Document Revised: 09/26/2014 Document Reviewed: 08/17/2013 Elsevier Interactive Patient Education [employer]2016 Elsevier Inc.

## 2015-08-08 ENCOUNTER — Inpatient Hospital Stay (HOSPITAL_COMMUNITY)
Admission: AD | Admit: 2015-08-08 | Discharge: 2015-08-08 | Disposition: A | Discharge status: Home / Self Care | Payer: Medicaid Other | Source: Ambulatory Visit | Attending: Obstetrics and Gynecology | Admitting: Obstetrics and Gynecology

## 2015-08-08 ENCOUNTER — Encounter (HOSPITAL_COMMUNITY): Payer: Self-pay | Admitting: *Deleted

## 2015-08-08 DIAGNOSIS — Z880 Allergy status to penicillin: Secondary | ICD-10-CM | POA: Diagnosis not present

## 2015-08-08 DIAGNOSIS — O99513 Diseases of the respiratory system complicating pregnancy, third trimester: Secondary | ICD-10-CM | POA: Insufficient documentation

## 2015-08-08 DIAGNOSIS — R109 Unspecified abdominal pain: Secondary | ICD-10-CM | POA: Diagnosis not present

## 2015-08-08 DIAGNOSIS — O26893 Other specified pregnancy related conditions, third trimester: Secondary | ICD-10-CM | POA: Diagnosis not present

## 2015-08-08 DIAGNOSIS — O26899 Other specified pregnancy related conditions, unspecified trimester: Secondary | ICD-10-CM

## 2015-08-08 DIAGNOSIS — O9989 Other specified diseases and conditions complicating pregnancy, childbirth and the puerperium: Secondary | ICD-10-CM

## 2015-08-08 DIAGNOSIS — J45909 Unspecified asthma, uncomplicated: Secondary | ICD-10-CM | POA: Diagnosis not present

## 2015-08-08 DIAGNOSIS — Z3A31 31 weeks gestation of pregnancy: Secondary | ICD-10-CM | POA: Diagnosis not present

## 2015-08-08 DIAGNOSIS — Z87891 Personal history of nicotine dependence: Secondary | ICD-10-CM | POA: Insufficient documentation

## 2015-08-08 LAB — URINALYSIS, ROUTINE W REFLEX MICROSCOPIC
BILIRUBIN URINE: NEGATIVE
Glucose, UA: NEGATIVE mg/dL
HGB URINE DIPSTICK: NEGATIVE
Ketones, ur: 15 mg/dL — AB
Leukocytes, UA: NEGATIVE
Nitrite: NEGATIVE
PH: 6 (ref 5.0–8.0)
Protein, ur: NEGATIVE mg/dL

## 2015-08-08 NOTE — Discharge Instructions (Signed)
Braxton Hicks Contractions °Contractions of the uterus can occur throughout pregnancy. Contractions are not always a sign that you are in labor.  °WHAT ARE BRAXTON HICKS CONTRACTIONS?  °Contractions that occur before labor are called Braxton Hicks contractions, or false labor. Toward the end of pregnancy (32-34 weeks), these contractions can develop more often and may become more forceful. This is not true labor because these contractions do not result in opening (dilatation) and thinning of the cervix. They are sometimes difficult to tell apart from true labor because these contractions can be forceful and people have different pain tolerances. You should not feel embarrassed if you go to the hospital with false labor. Sometimes, the only way to tell if you are in true labor is for your health care provider to look for changes in the cervix. °If there are no prenatal problems or other health problems associated with the pregnancy, it is completely safe to be sent home with false labor and await the onset of true labor. °HOW CAN YOU TELL THE DIFFERENCE BETWEEN TRUE AND FALSE LABOR? °False Labor °· The contractions of false labor are usually shorter and not as hard as those of true labor.   °· The contractions are usually irregular.   °· The contractions are often felt in the front of the lower abdomen and in the groin.   °· The contractions may go away when you walk around or change positions while lying down.   °· The contractions get weaker and are shorter lasting as time goes on.   °· The contractions do not usually become progressively stronger, regular, and closer together as with true labor.   °True Labor °· Contractions in true labor last 30-70 seconds, become very regular, usually become more intense, and increase in frequency.   °· The contractions do not go away with walking.   °· The discomfort is usually felt in the top of the uterus and spreads to the lower abdomen and low back.   °· True labor can be  determined by your health care provider with an exam. This will show that the cervix is dilating and getting thinner.   °WHAT TO REMEMBER °· Keep up with your usual exercises and follow other instructions given by your health care provider.   °· Take medicines as directed by your health care provider.   °· Keep your regular prenatal appointments.   °· Eat and drink lightly if you think you are going into labor.   °· If Braxton Hicks contractions are making you uncomfortable:   °¨ Change your position from lying down or resting to walking, or from walking to resting.   °¨ Sit and rest in a tub of warm water.   °¨ Drink 2-3 glasses of water. Dehydration may cause these contractions.   °¨ Do slow and deep breathing several times an hour.   °WHEN SHOULD I SEEK IMMEDIATE MEDICAL CARE? °Seek immediate medical care if: °· Your contractions become stronger, more regular, and closer together.   °· You have fluid leaking or gushing from your vagina.   °· You have a fever.   °· You pass blood-tinged mucus.   °· You have vaginal bleeding.   °· You have continuous abdominal pain.   °· You have low back pain that you never had before.   °· You feel your baby's head pushing down and causing pelvic pressure.   °· Your baby is not moving as much as it used to.   °  °This information is not intended to replace advice given to you by your health care provider. Make sure you discuss any questions you have with your health care   provider. °  °Document Released: 05/12/2005 Document Revised: 05/17/2013 Document Reviewed: 02/21/2013 °Elsevier Interactive Patient Education ©2016 Elsevier Inc. ° °

## 2015-08-08 NOTE — MAU Provider Note (Signed)
History     CSN: [id number]  Arrival date and time: 08/08/15 [id number]   First Provider Initiated Contact with Patient 08/08/15 [phone]      Chief Complaint  Patient presents with  . Contractions   HPI Ms. Donna Parks is a 30 y.o. G3P2002 at 5136w5d who presents to MAU today with complaint of contractions since 1230. The patient describes the contractions as a constant cramping x 1 hours and then intermittent cramping since then. She feels less intense and no longer constant pain. She denies vaginal bleeding, abnormal discharge or LOF. She also denies UTI symptoms, N/V/D recently or complications with this or previous pregnancies. She reports good fetal movement.   OB History    Gravida Para Term Preterm AB TAB SAB Ectopic Multiple Living   3 2 2       2       Past Medical History  Diagnosis Date  . Asthma   . Vaginal Pap smear, abnormal     Past Surgical History  Procedure Laterality Date  . No past surgeries      Family History  Problem Relation Age of Onset  . Cancer Other   . Diabetes Other   . Hypertension Other     Social History  Substance Use Topics  . Smoking status: Former Smoker -- 0.50 packs/day    Types: Cigarettes  . Smokeless tobacco: None  . Alcohol Use: No    Allergies:  Allergies  Allergen Reactions  . Penicillins Hives    Has patient had a PCN reaction causing immediate rash, facial/tongue/throat swelling, SOB or lightheadedness with hypotension: No Has patient had a PCN reaction causing severe rash involving mucus membranes or skin necrosis: No Has patient had a PCN reaction that required hospitalization No Has patient had a PCN reaction occurring within the last 10 years: No If all of the above answers are "NO", then may proceed with Cephalosporin use.    Prescriptions prior to admission  Medication Sig Dispense Refill Last Dose  . albuterol (PROVENTIL HFA;VENTOLIN HFA) 108 (90 BASE) MCG/ACT inhaler Inhale 2 puffs into the lungs every 6  (six) hours as needed for wheezing or shortness of breath.    Past Month at Unknown time  . Prenatal Multivit-Min-Fe-FA (PRENATAL VITAMINS) 0.8 MG tablet Take 1 tablet by mouth daily. 30 tablet 12 More than a month at Unknown time  . promethazine (PHENERGAN) 25 MG tablet Take 0.5-1 tablets (12.5-25 mg total) by mouth every 6 (six) hours as needed for nausea. 30 tablet 0     Review of Systems  Constitutional: Negative for fever.  Gastrointestinal: Positive for abdominal pain. Negative for nausea, vomiting, diarrhea and constipation.  Genitourinary: Negative for dysuria, urgency and frequency.       Neg - vaginal bleeding, discharge, LOF   Physical Exam   Blood pressure 132/73, pulse 79, temperature 98.2 F (36.8 C), temperature source Oral, resp. rate 18, height 5\' 4"  (1.626 m), weight 187 lb 6 oz (84.993 kg), last menstrual period 12/29/2014.  Physical Exam  Nursing note and vitals reviewed. Constitutional: She is oriented to person, place, and time. She appears well-developed and well-nourished. No distress.  HENT:  Head: Normocephalic and atraumatic.  Cardiovascular: Normal rate.   Respiratory: Effort normal.  GI: Soft. She exhibits no distension and no mass. There is no tenderness. There is no rebound and no guarding.  Neurological: She is alert and oriented to person, place, and time.  Skin: Skin is warm and dry. No erythema.  Psychiatric: She has a normal mood and affect.  Dilation: Closed Effacement (%): Thick Cervical Position: Posterior Exam by:: Harlon Flor PA-C   Results for orders placed or performed during the hospital encounter of 08/08/15 (from the past 24 hour(s))  Urinalysis, Routine w reflex microscopic (not at [facility])     Status: Abnormal   Collection Time: 08/08/15  4:09 AM  Result Value Ref Range   Color, Urine YELLOW YELLOW   APPearance CLEAR CLEAR   Specific Gravity, Urine >1.030 (H) 1.005 - 1.030   pH 6.0 5.0 - 8.0   Glucose, UA NEGATIVE NEGATIVE mg/dL    Hgb urine dipstick NEGATIVE NEGATIVE   Bilirubin Urine NEGATIVE NEGATIVE   Ketones, ur 15 (A) NEGATIVE mg/dL   Protein, ur NEGATIVE NEGATIVE mg/dL   Nitrite NEGATIVE NEGATIVE   Leukocytes, UA NEGATIVE NEGATIVE   Fetal Monitoring: Baseline: 130 bpm Variability: moderate Accelerations: 15 x 15 Decelerations: few small variables initially, resolved Contractions: mild UI  MAU Course  Procedures None  MDM UA today shows mild dehydration PO hydration encouraged while in MAU FFN collected prior to cervical exam. As cervix is closed, Dr. Dareen Piano agrees it does not need to be sent Discussed patient with Dr. Dareen Piano. He recommends discharge at this time. Patient to follow-up as scheduled.   Assessment and Plan  A: SIUP at [redacted]w[redacted]d Abdominal pain in pregnancy, third trimester  P: Discharge home Preterm labor precautions discussed Increased PO hydration advised Patient advised to follow-up with [facility] as scheduled or sooner PRN Patient may return to MAU as needed or if her condition were to change or worsen   Marny Lowenstein, PA-C  08/08/2015, 5:14 AM

## 2015-08-08 NOTE — MAU Note (Signed)
PT SAYS SSHE STARTED HAVING  CRAMPS   AT 0030.    HAS HAD  UC  BEFORE     CALLED   DR Dareen PianoANDERSON-   TOLD  TO DRINK  WATER -   UC  CONTINUED.    NO VE IN OFFICE.   HAS AN APPOINTMENT  ON 3-17   DENIES HSV AND MRSA.   LAST SEX-    1-2 WEEKS AGO.

## 2015-09-11 ENCOUNTER — Other Ambulatory Visit: Payer: Self-pay | Admitting: Obstetrics and Gynecology

## 2015-09-11 LAB — OB RESULTS CONSOLE GBS: STREP GROUP B AG: NEGATIVE

## 2015-09-20 ENCOUNTER — Inpatient Hospital Stay (HOSPITAL_COMMUNITY)
Admission: AD | Admit: 2015-09-20 | Discharge: 2015-09-20 | Disposition: A | Discharge status: Home / Self Care | Payer: Medicaid Other | Source: Ambulatory Visit | Attending: Obstetrics and Gynecology | Admitting: Obstetrics and Gynecology

## 2015-09-20 ENCOUNTER — Encounter (HOSPITAL_COMMUNITY): Payer: Self-pay | Admitting: *Deleted

## 2015-09-20 DIAGNOSIS — Z3493 Encounter for supervision of normal pregnancy, unspecified, third trimester: Secondary | ICD-10-CM | POA: Insufficient documentation

## 2015-09-20 NOTE — MAU Note (Signed)
Urine sent to lab 

## 2015-09-20 NOTE — Discharge Instructions (Signed)
Braxton Hicks Contractions °Contractions of the uterus can occur throughout pregnancy. Contractions are not always a sign that you are in labor.  °WHAT ARE BRAXTON HICKS CONTRACTIONS?  °Contractions that occur before labor are called Braxton Hicks contractions, or false labor. Toward the end of pregnancy (32-34 weeks), these contractions can develop more often and may become more forceful. This is not true labor because these contractions do not result in opening (dilatation) and thinning of the cervix. They are sometimes difficult to tell apart from true labor because these contractions can be forceful and people have different pain tolerances. You should not feel embarrassed if you go to the hospital with false labor. Sometimes, the only way to tell if you are in true labor is for your health care provider to look for changes in the cervix. °If there are no prenatal problems or other health problems associated with the pregnancy, it is completely safe to be sent home with false labor and await the onset of true labor. °HOW CAN YOU TELL THE DIFFERENCE BETWEEN TRUE AND FALSE LABOR? °False Labor °· The contractions of false labor are usually shorter and not as hard as those of true labor.   °· The contractions are usually irregular.   °· The contractions are often felt in the front of the lower abdomen and in the groin.   °· The contractions may go away when you walk around or change positions while lying down.   °· The contractions get weaker and are shorter lasting as time goes on.   °· The contractions do not usually become progressively stronger, regular, and closer together as with true labor.   °True Labor °· Contractions in true labor last 30-70 seconds, become very regular, usually become more intense, and increase in frequency.   °· The contractions do not go away with walking.   °· The discomfort is usually felt in the top of the uterus and spreads to the lower abdomen and low back.   °· True labor can be  determined by your health care provider with an exam. This will show that the cervix is dilating and getting thinner.   °WHAT TO REMEMBER °· Keep up with your usual exercises and follow other instructions given by your health care provider.   °· Take medicines as directed by your health care provider.   °· Keep your regular prenatal appointments.   °· Eat and drink lightly if you think you are going into labor.   °· If Braxton Hicks contractions are making you uncomfortable:   °¨ Change your position from lying down or resting to walking, or from walking to resting.   °¨ Sit and rest in a tub of warm water.   °¨ Drink 2-3 glasses of water. Dehydration may cause these contractions.   °¨ Do slow and deep breathing several times an hour.   °WHEN SHOULD I SEEK IMMEDIATE MEDICAL CARE? °Seek immediate medical care if: °· Your contractions become stronger, more regular, and closer together.   °· You have fluid leaking or gushing from your vagina.   °· You have a fever.   °· You have vaginal bleeding.   °· You have continuous abdominal pain.   °· You have low back pain that you never had before.   °· You feel your baby's head pushing down and causing pelvic pressure.   °· Your baby is not moving as much as it used to.   °  °This information is not intended to replace advice given to you by your health care provider. Make sure you discuss any questions you have with your health care provider. °  °Document Released: 05/12/2005   Document Revised: 05/17/2013 Document Reviewed: 02/21/2013 °Elsevier Interactive Patient Education ©2016 Elsevier Inc. ° °

## 2015-09-20 NOTE — MAU Note (Signed)
Pt was seen in MD office yesterday, was 2.5 cm's.  Having a lot of pelvic pressure this morning, irregular uc's, denies bleeding or LOF.

## 2015-09-23 ENCOUNTER — Encounter (HOSPITAL_COMMUNITY): Payer: Self-pay | Admitting: *Deleted

## 2015-09-23 ENCOUNTER — Inpatient Hospital Stay (HOSPITAL_COMMUNITY)
Admission: AD | Admit: 2015-09-23 | Discharge: 2015-09-23 | Disposition: A | Discharge status: Home / Self Care | Payer: Medicaid Other | Source: Ambulatory Visit | Attending: Obstetrics and Gynecology | Admitting: Obstetrics and Gynecology

## 2015-09-23 DIAGNOSIS — Z3493 Encounter for supervision of normal pregnancy, unspecified, third trimester: Secondary | ICD-10-CM | POA: Diagnosis present

## 2015-09-23 NOTE — Progress Notes (Signed)
Dr Tenny Crawoss notified of patients presenting complaint, vaginal exam and fht. Orders for reactive reassuring FHT and may d/c home unless changes happens

## 2015-09-23 NOTE — MAU Note (Addendum)
Feeling pain and pressure. Contractions aren't regular. Baby is very active

## 2015-10-06 ENCOUNTER — Inpatient Hospital Stay (HOSPITAL_COMMUNITY): Payer: Medicaid Other | Admitting: Anesthesiology

## 2015-10-06 ENCOUNTER — Encounter (HOSPITAL_COMMUNITY): Payer: Self-pay | Admitting: *Deleted

## 2015-10-06 ENCOUNTER — Inpatient Hospital Stay (HOSPITAL_COMMUNITY)
Admission: AD | Admit: 2015-10-06 | Discharge: 2015-10-08 | DRG: 775 | Disposition: A | Discharge status: Home / Self Care | Payer: Medicaid Other | Source: Ambulatory Visit | Attending: Obstetrics and Gynecology | Admitting: Obstetrics and Gynecology

## 2015-10-06 DIAGNOSIS — Z9889 Other specified postprocedural states: Secondary | ICD-10-CM | POA: Diagnosis present

## 2015-10-06 DIAGNOSIS — Z87891 Personal history of nicotine dependence: Secondary | ICD-10-CM

## 2015-10-06 DIAGNOSIS — Z3A40 40 weeks gestation of pregnancy: Secondary | ICD-10-CM

## 2015-10-06 DIAGNOSIS — O4202 Full-term premature rupture of membranes, onset of labor within 24 hours of rupture: Secondary | ICD-10-CM | POA: Diagnosis present

## 2015-10-06 LAB — CBC
HEMATOCRIT: 33.2 % — AB (ref 36.0–46.0)
HEMOGLOBIN: 11.9 g/dL — AB (ref 12.0–15.0)
MCH: 31.6 pg (ref 26.0–34.0)
MCHC: 35.8 g/dL (ref 30.0–36.0)
MCV: 88.1 fL (ref 78.0–100.0)
Platelets: 256 10*3/uL (ref 150–400)
RBC: 3.77 MIL/uL — AB (ref 3.87–5.11)
RDW: 13.4 % (ref 11.5–15.5)
WBC: 8.5 10*3/uL (ref 4.0–10.5)

## 2015-10-06 LAB — RPR: RPR Ser Ql: NONREACTIVE

## 2015-10-06 LAB — POCT FERN TEST: POCT FERN TEST: POSITIVE

## 2015-10-06 LAB — TYPE AND SCREEN
ABO/RH(D): A POS
ANTIBODY SCREEN: NEGATIVE

## 2015-10-06 MED ORDER — EPHEDRINE 5 MG/ML INJ
10.0000 mg | INTRAVENOUS | Status: DC | PRN
  Filled 2015-10-06: qty 2

## 2015-10-06 MED ORDER — FLEET ENEMA 7-19 GM/118ML RE ENEM: 1.0000 | ENEMA | RECTAL | Status: DC | PRN

## 2015-10-06 MED ORDER — SIMETHICONE 80 MG PO CHEW
80.0000 mg | CHEWABLE_TABLET | ORAL | Status: DC | PRN
Start: 2015-10-06 — End: 2015-10-08
  Administered 2015-10-07: 80 mg via ORAL
  Filled 2015-10-06: qty 1

## 2015-10-06 MED ORDER — DIPHENHYDRAMINE HCL 25 MG PO CAPS: 25.0000 mg | ORAL_CAPSULE | Freq: Four times a day (QID) | ORAL | Status: DC | PRN

## 2015-10-06 MED ORDER — FERROUS SULFATE 325 (65 FE) MG PO TABS
325.0000 mg | ORAL_TABLET | Freq: Two times a day (BID) | ORAL | Status: DC
  Administered 2015-10-06 – 2015-10-08 (×4): 325 mg via ORAL
  Filled 2015-10-06 (×4): qty 1

## 2015-10-06 MED ORDER — LIDOCAINE HCL (PF) 1 % IJ SOLN
30.0000 mL | INTRAMUSCULAR | Status: DC | PRN
  Filled 2015-10-06: qty 30

## 2015-10-06 MED ORDER — LIDOCAINE HCL (PF) 1 % IJ SOLN
INTRAMUSCULAR | Status: DC | PRN
Start: 2015-10-06 — End: 2015-10-06
  Administered 2015-10-06 (×2): 5 mL

## 2015-10-06 MED ORDER — OXYCODONE-ACETAMINOPHEN 5-325 MG PO TABS
2.0000 | ORAL_TABLET | ORAL | Status: DC | PRN
  Administered 2015-10-06 – 2015-10-08 (×5): 2 via ORAL
  Filled 2015-10-06 (×5): qty 2

## 2015-10-06 MED ORDER — SODIUM CHLORIDE 0.9% FLUSH: 3.0000 mL | Freq: Two times a day (BID) | INTRAVENOUS | Status: DC

## 2015-10-06 MED ORDER — MAGNESIUM HYDROXIDE 400 MG/5ML PO SUSP: 30.0000 mL | ORAL | Status: DC | PRN

## 2015-10-06 MED ORDER — LACTATED RINGERS IV SOLN
INTRAVENOUS | Status: DC
  Administered 2015-10-06: 125 mL/h via INTRAVENOUS
  Administered 2015-10-06: 05:00:00 via INTRAVENOUS

## 2015-10-06 MED ORDER — ONDANSETRON HCL 4 MG/2ML IJ SOLN: 4.0000 mg | INTRAMUSCULAR | Status: DC | PRN

## 2015-10-06 MED ORDER — ACETAMINOPHEN 325 MG PO TABS
650.0000 mg | ORAL_TABLET | ORAL | Status: DC | PRN
  Administered 2015-10-07 (×2): 650 mg via ORAL
  Filled 2015-10-06 (×2): qty 2

## 2015-10-06 MED ORDER — CITRIC ACID-SODIUM CITRATE 334-500 MG/5ML PO SOLN: 30.0000 mL | ORAL | Status: DC | PRN

## 2015-10-06 MED ORDER — FENTANYL 2.5 MCG/ML BUPIVACAINE 1/10 % EPIDURAL INFUSION (WH - ANES)
14.0000 mL/h | INTRAMUSCULAR | Status: DC | PRN
  Administered 2015-10-06: 14 mL/h via EPIDURAL
  Filled 2015-10-06: qty 125

## 2015-10-06 MED ORDER — SODIUM CHLORIDE 0.9 % IV SOLN: 250.0000 mL | INTRAVENOUS | Status: DC | PRN

## 2015-10-06 MED ORDER — SODIUM CHLORIDE 0.9% FLUSH: 3.0000 mL | INTRAVENOUS | Status: DC | PRN

## 2015-10-06 MED ORDER — OXYTOCIN 40 UNITS IN LACTATED RINGERS INFUSION - SIMPLE MED
1.0000 m[IU]/min | INTRAVENOUS | Status: DC
  Administered 2015-10-06: 2 m[IU]/min via INTRAVENOUS
  Filled 2015-10-06: qty 1000

## 2015-10-06 MED ORDER — BUTORPHANOL TARTRATE 1 MG/ML IJ SOLN: 1.0000 mg | INTRAMUSCULAR | Status: DC | PRN

## 2015-10-06 MED ORDER — TERBUTALINE SULFATE 1 MG/ML IJ SOLN
0.2500 mg | Freq: Once | INTRAMUSCULAR | Status: DC | PRN
  Filled 2015-10-06: qty 1

## 2015-10-06 MED ORDER — PHENYLEPHRINE 40 MCG/ML (10ML) SYRINGE FOR IV PUSH (FOR BLOOD PRESSURE SUPPORT)
80.0000 ug | PREFILLED_SYRINGE | INTRAVENOUS | Status: DC | PRN
Start: 2015-10-06 — End: 2015-10-06
  Filled 2015-10-06: qty 5

## 2015-10-06 MED ORDER — ALBUTEROL SULFATE (2.5 MG/3ML) 0.083% IN NEBU: 3.0000 mL | INHALATION_SOLUTION | Freq: Four times a day (QID) | RESPIRATORY_TRACT | Status: DC | PRN

## 2015-10-06 MED ORDER — ONDANSETRON HCL 4 MG PO TABS: 4.0000 mg | ORAL_TABLET | ORAL | Status: DC | PRN

## 2015-10-06 MED ORDER — COCONUT OIL OIL: 1.0000 "application " | TOPICAL_OIL | Status: DC | PRN

## 2015-10-06 MED ORDER — ACETAMINOPHEN 325 MG PO TABS: 650.0000 mg | ORAL_TABLET | ORAL | Status: DC | PRN

## 2015-10-06 MED ORDER — PRENATAL MULTIVITAMIN CH
1.0000 | ORAL_TABLET | Freq: Every day | ORAL | Status: DC
  Administered 2015-10-07: 1 via ORAL
  Filled 2015-10-06: qty 1

## 2015-10-06 MED ORDER — LACTATED RINGERS IV SOLN: 500.0000 mL | INTRAVENOUS | Status: DC | PRN

## 2015-10-06 MED ORDER — OXYCODONE-ACETAMINOPHEN 5-325 MG PO TABS: 2.0000 | ORAL_TABLET | ORAL | Status: DC | PRN

## 2015-10-06 MED ORDER — SENNOSIDES-DOCUSATE SODIUM 8.6-50 MG PO TABS
2.0000 | ORAL_TABLET | ORAL | Status: DC
  Administered 2015-10-06 – 2015-10-07 (×2): 2 via ORAL
  Filled 2015-10-06 (×2): qty 2

## 2015-10-06 MED ORDER — LACTATED RINGERS IV SOLN: 500.0000 mL | Freq: Once | INTRAVENOUS | Status: DC

## 2015-10-06 MED ORDER — OXYTOCIN 40 UNITS IN LACTATED RINGERS INFUSION - SIMPLE MED: 2.5000 [IU]/h | INTRAVENOUS | Status: DC

## 2015-10-06 MED ORDER — ONDANSETRON HCL 4 MG/2ML IJ SOLN: 4.0000 mg | Freq: Four times a day (QID) | INTRAMUSCULAR | Status: DC | PRN

## 2015-10-06 MED ORDER — METHYLERGONOVINE MALEATE 0.2 MG/ML IJ SOLN: 0.2000 mg | INTRAMUSCULAR | Status: DC | PRN

## 2015-10-06 MED ORDER — ZOLPIDEM TARTRATE 5 MG PO TABS: 5.0000 mg | ORAL_TABLET | Freq: Every evening | ORAL | Status: DC | PRN

## 2015-10-06 MED ORDER — PHENYLEPHRINE 40 MCG/ML (10ML) SYRINGE FOR IV PUSH (FOR BLOOD PRESSURE SUPPORT)
80.0000 ug | PREFILLED_SYRINGE | INTRAVENOUS | Status: DC | PRN
  Filled 2015-10-06: qty 10
  Filled 2015-10-06: qty 5

## 2015-10-06 MED ORDER — IBUPROFEN 800 MG PO TABS
800.0000 mg | ORAL_TABLET | Freq: Three times a day (TID) | ORAL | Status: DC
  Administered 2015-10-06 – 2015-10-08 (×6): 800 mg via ORAL
  Filled 2015-10-06 (×6): qty 1

## 2015-10-06 MED ORDER — WITCH HAZEL-GLYCERIN EX PADS
1.0000 "application " | MEDICATED_PAD | CUTANEOUS | Status: DC | PRN
  Administered 2015-10-06: 1 via TOPICAL

## 2015-10-06 MED ORDER — DIPHENHYDRAMINE HCL 50 MG/ML IJ SOLN: 12.5000 mg | INTRAMUSCULAR | Status: DC | PRN

## 2015-10-06 MED ORDER — OXYCODONE-ACETAMINOPHEN 5-325 MG PO TABS: 1.0000 | ORAL_TABLET | ORAL | Status: DC | PRN

## 2015-10-06 MED ORDER — MEASLES, MUMPS & RUBELLA VAC ~~LOC~~ INJ
0.5000 mL | INJECTION | Freq: Once | SUBCUTANEOUS | Status: DC
  Filled 2015-10-06: qty 0.5

## 2015-10-06 MED ORDER — BENZOCAINE-MENTHOL 20-0.5 % EX AERO
1.0000 "application " | INHALATION_SPRAY | CUTANEOUS | Status: DC | PRN
  Administered 2015-10-06 – 2015-10-07 (×3): 1 via TOPICAL
  Filled 2015-10-06 (×3): qty 56

## 2015-10-06 MED ORDER — TETANUS-DIPHTH-ACELL PERTUSSIS 5-2.5-18.5 LF-MCG/0.5 IM SUSP: 0.5000 mL | Freq: Once | INTRAMUSCULAR | Status: DC

## 2015-10-06 MED ORDER — DIBUCAINE 1 % RE OINT: 1.0000 "application " | TOPICAL_OINTMENT | RECTAL | Status: DC | PRN

## 2015-10-06 MED ORDER — METHYLERGONOVINE MALEATE 0.2 MG PO TABS: 0.2000 mg | ORAL_TABLET | ORAL | Status: DC | PRN

## 2015-10-06 MED ORDER — OXYTOCIN BOLUS FROM INFUSION: 500.0000 mL | INTRAVENOUS | Status: DC

## 2015-10-06 MED ORDER — OXYCODONE-ACETAMINOPHEN 5-325 MG PO TABS
1.0000 | ORAL_TABLET | ORAL | Status: DC | PRN
  Administered 2015-10-07: 1 via ORAL
  Filled 2015-10-06: qty 1

## 2015-10-06 NOTE — Progress Notes (Signed)
Dr Henderson CloudHorvath notified of pt's admission and status. [facility]

## 2015-10-06 NOTE — MAU Note (Signed)
SROM at 0330. 3-4cm on Friday. No ctxs

## 2015-10-06 NOTE — Anesthesia Procedure Notes (Signed)
Epidural Patient location during procedure: OB  Staffing Anesthesiologist: Jaymar Loeber Performed by: anesthesiologist   Preanesthetic Checklist Completed: patient identified, site marked, surgical consent, pre-op evaluation, timeout performed, IV checked, risks and benefits discussed and monitors and equipment checked  Epidural Patient position: sitting Prep: DuraPrep Patient monitoring: heart rate, continuous pulse ox and blood pressure Approach: midline Location: L3-L4 Injection technique: LOR saline  Needle:  Needle type: Tuohy  Needle gauge: 17 G Needle length: 9 cm and 9 Needle insertion depth: 5 cm Catheter type: closed end flexible Catheter size: 20 Guage Catheter at skin depth: 10 cm Test dose: negative  Assessment Events: blood not aspirated, injection not painful, no injection resistance, negative IV test and no paresthesia  Additional Notes Patient identified. Risks/Benefits/Options discussed with patient including but not limited to bleeding, infection, nerve damage, paralysis, failed block, incomplete pain control, headache, blood pressure changes, nausea, vomiting, reactions to medication both or allergic, itching and postpartum back pain. Confirmed with bedside nurse the patient's most recent platelet count. Confirmed with patient that they are not currently taking any anticoagulation, have any bleeding history or any family history of bleeding disorders. Patient expressed understanding and wished to proceed. All questions were answered. Sterile technique was used throughout the entire procedure. Please see nursing notes for vital signs. Test dose was given through epidural needle and negative prior to continuing to dose epidural or start infusion. Warning signs of high block given to the patient including shortness of breath, tingling/numbness in hands, complete motor block, or any concerning symptoms with instructions to call for help. Patient was given  instructions on fall risk and not to get out of bed. All questions and concerns addressed with instructions to call with any issues.   

## 2015-10-06 NOTE — Lactation Note (Signed)
This note was copied from a baby's chart. Lactation Consultation Note  Patient Name: Donna Parks RUEAV'WToday's Date: 10/06/2015 Reason for consult: Initial assessment Infant is 5 hours old & [facility]. Baby was born at 6925w1d & weighed 8+1.1# at birth. Mom was BF when LC entered- her nurse had helped her latch in the cradle hold on her left breast. Infant was falling asleep when LC was there & needed stimulation to keep her suckling. Swallows were noted. Mom reports that she did not BF her 1st child & only BF her 2nd for ~2 weeks. Mom stated that she has not made her mind up about this one but has not given a bottle yet (visitors in room were encouraging mom to continue BF). Mom was sleepy while BF & when baby came off, mom wrapped baby up & held her so no major adjustments were made when she was BF. Mom made aware of O/P services, breastfeeding support groups, community resources, and our phone # for post-discharge questions. Reviewed Baby & Me book's Breastfeeding Basics; encouraged mom to use cross-cradle or football hold at future feedings to provide more support & keep her latched/ suckling longer. Mom encouraged to feed baby 8-12 times/24 hours and with feeding cues. Provided mom with hand pump; showed her how to use & clean it. Mom reports no questions at this time. Encouraged mom to ask for her nurse and/ or lactation at a future feed to help with latch.   Maternal Data Does the patient have breastfeeding experience prior to this delivery?: Yes  Feeding Feeding Type: Breast Fed Length of feed: 15 min  LATCH Score/Interventions Latch: Grasps breast easily, tongue down, lips flanged, rhythmical sucking. Intervention(s): Adjust position  Audible Swallowing: A few with stimulation Intervention(s): Hand expression;Skin to skin  Type of Nipple: Everted at rest and after stimulation  Comfort (Breast/Nipple): Soft / non-tender     Hold (Positioning): No assistance  needed to correctly position infant at breast.  LATCH Score: 9  Lactation Tools Discussed/Used [facility] Pump Review: Setup, frequency, and cleaning;Milk Storage Initiated by:: Blondell RevealLaura Antwyne Pingree, IBCLC Date initiated:: 10/06/15   Consult Status Consult Status: Follow-up Date: 10/07/15 Follow-up type: In-patient    Oneal GroutLaura C Day Deery 10/06/2015, 6:11 PM

## 2015-10-06 NOTE — H&P (Signed)
30 y.o. 3764w1d  G3P2002 comes in c/o LOF at 0330.  Otherwise has good fetal movement and no bleeding.  Past Medical History  Diagnosis Date  . Asthma   . Vaginal Pap smear, abnormal     Past Surgical History  Procedure Laterality Date  . Tonsillectomy      OB History  Gravida Para Term Preterm AB SAB TAB Ectopic Multiple Living  3 2 2       2     # Outcome Date GA Lbr Len/2nd Weight Sex Delivery Anes PTL Lv  3 Current           2 Term      Vag-Spont     1 Term      Vag-Spont         Social History   Social History  . Marital Status: Single    Spouse Name: N/A  . Number of Children: N/A  . Years of Education: N/A   Occupational History  . Not on file.   Social History Main Topics  . Smoking status: Former Smoker -- 0.50 packs/day    Types: Cigarettes  . Smokeless tobacco: Not on file  . Alcohol Use: No  . Drug Use: No  . Sexual Activity: Not Currently    Birth Control/ Protection: None   Other Topics Concern  . Not on file   Social History Narrative   Penicillins    Prenatal Transfer Tool  Maternal Diabetes: No Genetic Screening: Normal Maternal Ultrasounds/Referrals: Normal Fetal Ultrasounds or other Referrals:  None Maternal Substance Abuse:  No Significant Maternal Medications:  None Significant Maternal Lab Results: None  Other PNC: uncomplicated.    Filed Vitals:   10/06/15 0904 10/06/15 0931  BP: 116/72 108/70  Pulse: 69 72  Temp:    Resp: 20 18     Lungs/Cor:  NAD Abdomen:  soft, gravid Ex:  no cords, erythema SVE:  4/80/-2, grossly ROM FHTs:  130, good STV, NST R Toco:  q 3   A/P   Term ROM.  GBS neg.  Donna Parks A

## 2015-10-06 NOTE — Anesthesia Postprocedure Evaluation (Signed)
Anesthesia Post Note  Patient: Donna GuarneriKrystal M Parks  Procedure(s) Performed: * No procedures listed *  Patient location during evaluation: Mother Baby Anesthesia Type: Epidural Level of consciousness: awake and alert Pain management: pain level controlled Vital Signs Assessment: post-procedure vital signs reviewed and stable Respiratory status: spontaneous breathing, nonlabored ventilation and respiratory function stable Cardiovascular status: stable Postop Assessment: no headache, no backache, epidural receding, patient able to bend at knees and no signs of nausea or vomiting Anesthetic complications: no     Last Vitals:  Filed Vitals:   10/06/15 1415 10/06/15 1530  BP: 117/69 120/63  Pulse: 83 62  Temp: 36.8 C 36.7 C  Resp: 18 16    Last Pain:  Filed Vitals:   10/06/15 1539  PainSc: 2    Pain Goal: Patients Stated Pain Goal: 3 (10/06/15 1535)               Rica RecordsICKELTON,[name]

## 2015-10-06 NOTE — Anesthesia Preprocedure Evaluation (Signed)
Anesthesia Evaluation  Patient identified by MRN, date of birth, ID band Patient awake    Reviewed: Allergy & Precautions, H&P , NPO status , Patient's Chart, lab work & pertinent test results  History of Anesthesia Complications Negative for: history of anesthetic complications  Airway Mallampati: II  TM Distance: >3 FB Neck ROM: full    Dental no notable dental hx. (+) Teeth Intact   Pulmonary asthma , former smoker,    Pulmonary exam normal breath sounds clear to auscultation       Cardiovascular negative cardio ROS Normal cardiovascular exam Rhythm:regular Rate:Normal     Neuro/Psych negative neurological ROS  negative psych ROS   GI/Hepatic negative GI ROS, Neg liver ROS,   Endo/Other  negative endocrine ROS  Renal/GU negative Renal ROS  negative genitourinary   Musculoskeletal   Abdominal   Peds  Hematology negative hematology ROS (+)   Anesthesia Other Findings   Reproductive/Obstetrics (+) Pregnancy                             Anesthesia Physical Anesthesia Plan  ASA: II  Anesthesia Plan: Epidural   Post-op Pain Management:    Induction:   Airway Management Planned:   Additional Equipment:   Intra-op Plan:   Post-operative Plan:   Informed Consent: I have reviewed the patients History and Physical, chart, labs and discussed the procedure including the risks, benefits and alternatives for the proposed anesthesia with the patient or authorized representative who has indicated his/her understanding and acceptance.     Plan Discussed with:   Anesthesia Plan Comments:         Anesthesia Quick Evaluation  

## 2015-10-07 LAB — CBC
HCT: 32.4 % — ABNORMAL LOW (ref 36.0–46.0)
HEMOGLOBIN: 11.2 g/dL — AB (ref 12.0–15.0)
MCH: 30.9 pg (ref 26.0–34.0)
MCHC: 34.6 g/dL (ref 30.0–36.0)
MCV: 89.5 fL (ref 78.0–100.0)
Platelets: 212 10*3/uL (ref 150–400)
RBC: 3.62 MIL/uL — AB (ref 3.87–5.11)
RDW: 13.6 % (ref 11.5–15.5)
WBC: 9.1 10*3/uL (ref 4.0–10.5)

## 2015-10-07 NOTE — Discharge Planning (Signed)
Patient is Rubella Immune per MD, though no lab result was available to print per Dr. Henderson CloudHorvath.

## 2015-10-07 NOTE — Discharge Summary (Signed)
Obstetric Discharge Summary Reason for Admission: onset of labor and rupture of membranes Prenatal Procedures: NST Intrapartum Procedures: spontaneous vaginal delivery Postpartum Procedures: none Complications-Operative and Postpartum: none HEMOGLOBIN  Date Value Ref Range Status  10/07/2015 11.2* 12.0 - 15.0 g/dL Final   HCT  Date Value Ref Range Status  10/07/2015 32.4* 36.0 - 46.0 % Final    Discharge Diagnoses: Term Pregnancy-delivered  Discharge Information: Date: 10/07/2015 Activity: pelvic rest Diet: routine Medications: Ibuprofen Condition: stable Instructions: refer to practice specific booklet Discharge to: home Follow-up Information    Follow up with Brieann Osinski A, MD In 4 weeks.   Specialty:  Obstetrics and Gynecology   Contact information:   [address] [place] [address] [phone]       Newborn Data: Live born female  Birth Weight: 8 lb 1.1 oz (3660 g) APGAR: 9, 9  Home with mother.  Donna Parks A 10/07/2015, 9:09 AM

## 2015-10-07 NOTE — Progress Notes (Signed)
Patient is eating, ambulating, voiding.  Pain control is good.  Filed Vitals:   10/06/15 1955 10/06/15 1956 10/07/15 0320 10/07/15 0617  BP: 110/70 129/85 119/62 116/54  Pulse:  67 59 60  Temp:   98.5 F (36.9 C) 97.6 F (36.4 C)  TempSrc:   Oral Oral  Resp:  18 20 20   Height:      Weight:      SpO2:        Fundus firm Perineum without swelling.  Lab Results  Component Value Date   WBC 9.1 10/07/2015   HGB 11.2* 10/07/2015   HCT 32.4* 10/07/2015   MCV 89.5 10/07/2015   PLT 212 10/07/2015    --/--/A POS (05/13 0452)/RI  A/P Post partum day 1.  Routine care.  Expect d/c routine.    Kunio Cummiskey A

## 2015-10-08 MED ORDER — OXYCODONE-ACETAMINOPHEN 2.5-325 MG PO TABS: 1.0000 | ORAL_TABLET | ORAL | Status: DC | PRN

## 2015-10-08 NOTE — Lactation Note (Signed)
This note was copied from a baby's chart. Lactation Consultation Note  Mother states she breastfeeds before offering formula but baby stops because flow is not as strong as bottle. Suggest that is mother wants to provide breastmilk to baby she needs to breastfeed on demand and reduce bottle feeding or pump with DEBP. Reviewed pumping every 3 hours if desired w/ good DEBP. Mother still undecided on how she wants to feed baby. Reviewed engorgement care and monitoring voids/stools. Suggest she call if she needs further assistance.  Patient Name: Donna Parks ZOXWR'UToday's Date: 10/08/2015 Reason for consult: Follow-up assessment   Maternal Data    Feeding    LATCH Score/Interventions                      Lactation Tools Discussed/Used     Consult Status Consult Status: Complete    Hardie PulleyBerkelhammer, Ruth Boschen 10/08/2015, 9:54 AM

## 2016-01-18 ENCOUNTER — Other Ambulatory Visit: Payer: Self-pay | Admitting: Obstetrics and Gynecology

## 2016-06-19 ENCOUNTER — Other Ambulatory Visit: Payer: Self-pay | Admitting: Obstetrics and Gynecology

## 2017-01-12 ENCOUNTER — Emergency Department (HOSPITAL_COMMUNITY): Payer: 59

## 2017-01-12 ENCOUNTER — Emergency Department (HOSPITAL_COMMUNITY)
Admission: EM | Admit: 2017-01-12 | Discharge: 2017-01-13 | Disposition: A | Discharge status: Home / Self Care | Payer: 59 | Attending: Emergency Medicine | Admitting: Emergency Medicine

## 2017-01-12 ENCOUNTER — Encounter (HOSPITAL_COMMUNITY): Payer: Self-pay | Admitting: *Deleted

## 2017-01-12 DIAGNOSIS — J45909 Unspecified asthma, uncomplicated: Secondary | ICD-10-CM | POA: Insufficient documentation

## 2017-01-12 DIAGNOSIS — Z79899 Other long term (current) drug therapy: Secondary | ICD-10-CM | POA: Insufficient documentation

## 2017-01-12 DIAGNOSIS — R51 Headache: Secondary | ICD-10-CM | POA: Insufficient documentation

## 2017-01-12 DIAGNOSIS — R519 Headache, unspecified: Secondary | ICD-10-CM

## 2017-01-12 DIAGNOSIS — R1030 Lower abdominal pain, unspecified: Secondary | ICD-10-CM

## 2017-01-12 DIAGNOSIS — N760 Acute vaginitis: Secondary | ICD-10-CM

## 2017-01-12 DIAGNOSIS — Z87891 Personal history of nicotine dependence: Secondary | ICD-10-CM | POA: Insufficient documentation

## 2017-01-12 DIAGNOSIS — B9689 Other specified bacterial agents as the cause of diseases classified elsewhere: Secondary | ICD-10-CM

## 2017-01-12 LAB — COMPREHENSIVE METABOLIC PANEL
ALBUMIN: 3.6 g/dL (ref 3.5–5.0)
ALK PHOS: 55 U/L (ref 38–126)
ALT: 23 U/L (ref 14–54)
ANION GAP: 7 (ref 5–15)
AST: 20 U/L (ref 15–41)
BILIRUBIN TOTAL: 0.5 mg/dL (ref 0.3–1.2)
BUN: 12 mg/dL (ref 6–20)
CALCIUM: 9.1 mg/dL (ref 8.9–10.3)
CO2: 26 mmol/L (ref 22–32)
CREATININE: 0.84 mg/dL (ref 0.44–1.00)
Chloride: 106 mmol/L (ref 101–111)
GFR calc Af Amer: >60 mL/min (ref >60)
GFR calc non Af Amer: >60 mL/min (ref >60)
GLUCOSE: 106 mg/dL — AB (ref 65–99)
Potassium: 3.7 mmol/L (ref 3.5–5.1)
Sodium: 139 mmol/L (ref 135–145)
TOTAL PROTEIN: 6.5 g/dL (ref 6.5–8.1)

## 2017-01-12 LAB — CBC
HCT: 41.5 % (ref 36.0–46.0)
Hemoglobin: 14.5 g/dL (ref 12.0–15.0)
MCH: 31.5 pg (ref 26.0–34.0)
MCHC: 34.9 g/dL (ref 30.0–36.0)
MCV: 90.2 fL (ref 78.0–100.0)
PLATELETS: 363 10*3/uL (ref 150–400)
RBC: 4.6 MIL/uL (ref 3.87–5.11)
RDW: 13 % (ref 11.5–15.5)
WBC: 7.5 10*3/uL (ref 4.0–10.5)

## 2017-01-12 LAB — URINALYSIS, ROUTINE W REFLEX MICROSCOPIC
BILIRUBIN URINE: NEGATIVE
Glucose, UA: NEGATIVE mg/dL
Hgb urine dipstick: NEGATIVE
KETONES UR: NEGATIVE mg/dL
Leukocytes, UA: NEGATIVE
Nitrite: NEGATIVE
Protein, ur: NEGATIVE mg/dL
Specific Gravity, Urine: 1.025 (ref 1.005–1.030)
pH: 6 (ref 5.0–8.0)

## 2017-01-12 LAB — LIPASE, BLOOD: Lipase: 30 U/L (ref 11–51)

## 2017-01-12 LAB — WET PREP, GENITAL
Sperm: NONE SEEN
TRICH WET PREP: NONE SEEN
Yeast Wet Prep HPF POC: NONE SEEN

## 2017-01-12 LAB — PREGNANCY, URINE: PREG TEST UR: NEGATIVE

## 2017-01-12 MED ORDER — SODIUM CHLORIDE 0.9 % IV BOLUS (SEPSIS)
1000.0000 mL | Freq: Once | INTRAVENOUS | Status: AC
  Administered 2017-01-12: 1000 mL via INTRAVENOUS

## 2017-01-12 MED ORDER — METOCLOPRAMIDE HCL 5 MG/ML IJ SOLN
10.0000 mg | Freq: Once | INTRAMUSCULAR | Status: AC
  Administered 2017-01-12: 10 mg via INTRAVENOUS
  Filled 2017-01-12: qty 2

## 2017-01-12 MED ORDER — DIPHENHYDRAMINE HCL 50 MG/ML IJ SOLN
25.0000 mg | Freq: Once | INTRAMUSCULAR | Status: AC
  Administered 2017-01-12: 25 mg via INTRAVENOUS
  Filled 2017-01-12: qty 1

## 2017-01-12 MED ORDER — KETOROLAC TROMETHAMINE 30 MG/ML IJ SOLN
30.0000 mg | Freq: Once | INTRAMUSCULAR | Status: DC
  Filled 2017-01-12: qty 1

## 2017-01-12 NOTE — ED Provider Notes (Signed)
[facility]-EMERGENCY DEPT Provider Note   CSN: [id number] Arrival date & time: 01/12/17  1523     History   Chief Complaint Chief Complaint  Patient presents with  . Headache    HPI Donna Parks is a 31 y.o. female who presents with 2 weeks of intermittent headache. Patient states that headache was gradual in nature and denies any thunderclap headache. She states that headache mostly occurs on the right side. She has taken intermittent tylenol with temporary improvement in pain. Patient reports that headache will improve and then return again. She denies any preceding trauma, fall or injury. Patient also notes that over the last 3 days she has had some intermittent blurry vision. She states that initially the blurry vision was located in the right eye and then the left. She states that has resolved. She denies any blurry vision at this time. Patient also here with complaints of persistent abdominal pain that has been ongoing for several months. Patient has reportedly seen her OB/GYN for evaluation of pain. Patient reports that she has been treated for BV which temporarily improves the pain but then returns. Patient does have an IUD and reports that she has not had any U/S imaging of the IUD.  Patient is conerned that hte IUD is in the wrong place. Patient states that her LMP was approximately 2 week ago. Patient is currently sexually active with one partner. They do not use any condoms. Patient denies any history of STDs. Patient reports that her last bowel movement was yesterday and denies any blood or melena. Patient denies any fever/chills, CP, SOB, nausea/vomiting, dysuria, hematuria, vaginal bleeding, slurred speech, facial assymetry, numbness/weakness of her arms or legs.   The history is provided by the patient.    Past Medical History:  Diagnosis Date  . Asthma   . Vaginal Pap smear, abnormal     Patient Active Problem List   Diagnosis Date Noted  . Indication for care in labor or  delivery 10/06/2015  . Postpartum state 10/06/2015    Past Surgical History:  Procedure Laterality Date  . TONSILLECTOMY      OB History    Gravida Para Term Preterm AB Living   3 3 3     1    SAB TAB Ectopic Multiple Live Births         0 1       Home Medications    Prior to Admission medications   Medication Sig Start Date End Date Taking? Authorizing Provider  acetaminophen (TYLENOL) 325 MG tablet Take 325 mg by mouth every 6 (six) hours as needed for headache.    [provider]  albuterol (PROVENTIL HFA;VENTOLIN HFA) 108 (90 BASE) MCG/ACT inhaler Inhale 2 puffs into the lungs every 6 (six) hours as needed for wheezing or shortness of breath.     [provider]  oxycodone-acetaminophen (PERCOCET) 2.5-325 MG tablet Take 1 tablet by mouth every 4 (four) hours as needed for pain. 10/08/15   Levi Aland, MD  Prenatal Multivit-Min-Fe-FA (PRENATAL VITAMINS) 0.8 MG tablet Take 1 tablet by mouth daily. Patient taking differently: Take 1 tablet by mouth daily.  02/04/15   Marlis Edelson, CNM    Family History Family History  Problem Relation Age of Onset  . Cancer Other   . Diabetes Other   . Hypertension Other     Social History Social History  Substance Use Topics  . Smoking status: Former Smoker    Packs/day: 0.50    Types:  Cigarettes    Quit date: 05/26/2014  . Smokeless tobacco: Never Used  . Alcohol use No     Allergies   Penicillins   Review of Systems Review of Systems  Constitutional: Negative for chills and fever.  HENT: Negative for congestion, rhinorrhea and sore throat.   Eyes: Positive for visual disturbance (resolved).  Respiratory: Negative for cough and shortness of breath.   Cardiovascular: Negative for chest pain.  Gastrointestinal: Positive for abdominal pain. Negative for blood in stool, diarrhea, nausea and vomiting.  Genitourinary: Negative for dysuria and hematuria.  Musculoskeletal: Negative for back pain and neck  pain.  Skin: Negative for rash.  Neurological: Positive for headaches. Negative for dizziness, facial asymmetry, weakness and numbness.  Psychiatric/Behavioral: Negative for confusion.  All other systems reviewed and are negative.    Physical Exam Updated Vital Signs BP (!) 141/83 (BP Location: Right Arm)   Pulse 80   Temp 98.2 F (36.8 C) (Oral)   Resp 14   SpO2 97%   Breastfeeding? No   Physical Exam  Constitutional: She is oriented to person, place, and time. She appears well-developed and well-nourished.  Sitting comfortably on examination table  HENT:  Head: Normocephalic and atraumatic.  Right Ear: Tympanic membrane normal.  Left Ear: Tympanic membrane normal.  Mouth/Throat: Oropharynx is clear and moist and mucous membranes are normal.  No tenderness to palpation of skull. No deformities or crepitus noted. No open wounds, abrasions or lacerations.   Eyes: Pupils are equal, round, and reactive to light. Conjunctivae, EOM and lids are normal.  Fundoscopic exam:      The right eye shows no hemorrhage and no papilledema.       The left eye shows no hemorrhage and no papilledema.  Neck: Full passive range of motion without pain.  Full flexion/extension and lateral movement of neck fully intact. No bony midline tenderness. No deformities or crepitus.   Cardiovascular: Normal rate, regular rhythm, normal heart sounds and normal pulses.  Exam reveals no gallop and no friction rub.   No murmur heard. Pulmonary/Chest: Effort normal and breath sounds normal.  No evidence of respiratory distress. Able to speak in full sentences without difficulty.  Abdominal: Soft. Normal appearance. There is tenderness in the suprapubic area. There is no rigidity, no guarding, no CVA tenderness and negative Murphy's sign.  Genitourinary: Uterus normal. Cervix exhibits no motion tenderness and no friability. Right adnexum displays no mass and no tenderness. Left adnexum displays no mass and no  tenderness. Vaginal discharge found.  Genitourinary Comments: The exam was performed with a chaperone present. Normal external female genitalia. No lesions, sores, or ulcers. White vaginal discharge present in the vaginal cana. Cervix with no friability, no CMT. No adnexal mass/tendenress bilaterally.   Musculoskeletal: Normal range of motion.  Neurological: She is alert and oriented to person, place, and time. GCS eye subscore is 4. GCS verbal subscore is 5. GCS motor subscore is 6.  Cranial nerves III-XII intact Follows commands, Moves all extremities  5/5 strength to BUE and BLE  Sensation intact throughout all major nerve distributions Normal finger to nose. No dysdiadochokinesia. No pronator drift. No gait abnormalities  Negative Rhomberg No slurred speech. No facial droop.   Skin: Skin is warm and dry. Capillary refill takes less than 2 seconds.  Psychiatric: She has a normal mood and affect. Her speech is normal.  Nursing note and vitals reviewed.    ED Treatments / Results  Labs (all labs ordered are listed, but only abnormal  results are displayed) Labs Reviewed  COMPREHENSIVE METABOLIC PANEL - Abnormal; Notable for the following:       Result Value   Glucose, Bld 106 (*)    All other components within normal limits  LIPASE, BLOOD  CBC  URINALYSIS, ROUTINE W REFLEX MICROSCOPIC    EKG  EKG Interpretation None       Radiology No results found.  Procedures Procedures (including critical care time)  Medications Ordered in ED Medications - No data to display   Initial Impression / Assessment and Plan / ED Course  I have reviewed the triage vital signs and the nursing notes.  Pertinent labs & imaging results that were available during my care of the patient were reviewed by me and considered in my medical decision making (see chart for details).     31 y.o. F who presents with 2 weeks of intermittent headache. Associated with some intermittent blurry vision  that occurred a few days ago. No fevers, nausea/vomiting. No trauma or fall. No history of migraines. Also with complaints of lower abdominal pain that has been ongoing for months. Seen by OB/GYN. No imaging. No neuro deficits on exam. No evidence of papilledema. Abdomen with some mild suprapubic tenderness. Consider acute infectious etiology vs migraines vs UTI vs pregnancy. Also consider intracranial mass and idiopathic intracranial hypertension. History/physical exam are not concerning for CVA, Cavernous sinus thrombosis, appendicits, diverticulitis.  Will plan to check basic labs including urine pregnancy, UA, Lipase, CBC, CMP. Will also check CT head for evaluation since this headache has been ongoing for 2 weeks and patient does not have a history of migraines. Will also plan for US pelvis for evaluation of IUD placement and ensure no malposition. IVF given for fluid resuscitation. Patient given migraine cocktail for headache relief.   Pelvic exam as documented above. Not concerning for PID.   Labs and imaging reviewed. UA and Urine pregnancy unremarkable. CBC within normal limits. CMP unremarkable. Lipase unremarkable. CT head is negative for any acute abnormality. U/S pelvis shows that IUD is in place. No other abnormalities. Wet prep is positive for clue cells. Discussed results with patient. She reports that headache has completely resolved. Repeat neuro exams still with no deficits. Plan to ambulate and PO challenge patient in the ED.   Patient able to ambulate in the ED without any difficulty. Patient has been able to tolerate PO. Vital signs are stable. Plan to discharge patient home with referral for outpatient neurology. Explained to patient that she will need to follow up with neurology for outpatient appointment and potential further workup. Patient also instructed to follow-up with her OB/GYN for further evaluation. Strict return precautions discussed. Patient expresses understanding and  agreement to plan.     Final Clinical Impressions(s) / ED Diagnoses   Final diagnoses:  Acute nonintractable headache, unspecified headache type  Lower abdominal pain  Bacterial vaginosis    New Prescriptions New Prescriptions   No medications on file     Rosana Hoes 01/14/17 0249    Little, Ambrose Finland, MD 01/15/17 [phone]

## 2017-01-12 NOTE — ED Notes (Signed)
Patient transported to Ultrasound 

## 2017-01-12 NOTE — ED Notes (Signed)
Patient transported to CT 

## 2017-01-12 NOTE — ED Notes (Signed)
Patient ambulating to BR, tolerating well. 

## 2017-01-12 NOTE — ED Triage Notes (Signed)
Pt c/o HA onset x 1 mth worsening over the last 2 weeks with intermittent blurred vision that started in the R eye then the L eye &  Yesterday was in both eyes, denies current blurred vision, no slurred speech with symptoms, pt c/o bil lower abd pain, c/o intermittent vaginal bleeding with spotting now, pt denies painful intercourse, A&O x4

## 2017-01-13 LAB — GC/CHLAMYDIA PROBE AMP (~~LOC~~) NOT AT ARMC
Chlamydia: NEGATIVE
NEISSERIA GONORRHEA: NEGATIVE

## 2017-01-13 MED ORDER — METRONIDAZOLE 500 MG PO TABS: 500.0000 mg | ORAL_TABLET | Freq: Two times a day (BID) | ORAL | 0 refills | Status: DC

## 2017-01-13 NOTE — Discharge Instructions (Signed)
You can take Tylenol or Ibuprofen as directed for pain.  Make sure you are drinking plenty of fluids and staying hydrated.   Take antibiotics as directed. Please take all of your antibiotics until finished.   Take Flagyl as directed.  It is very important that you do not consume any alcohol while taking this medication as it will cause you to become violently ill.  Follow-up with the referred neurologist as directed. Call their office and arrange for an appointment.  Return the emergency Department for any worsening headache, vomiting, vision changes, chest pain, difficulty breathing or any other worsening or concerning symptoms.

## 2017-01-13 NOTE — ED Notes (Signed)
Pt verbalized understanding of d/c instructions and has no further questions. Pt is stable, A&Ox4, VSS.  

## 2017-02-09 ENCOUNTER — Emergency Department (HOSPITAL_COMMUNITY)
Admission: EM | Admit: 2017-02-09 | Discharge: 2017-02-09 | Disposition: A | Discharge status: Home / Self Care | Payer: 59 | Attending: Emergency Medicine | Admitting: Emergency Medicine

## 2017-02-09 ENCOUNTER — Emergency Department (HOSPITAL_COMMUNITY): Payer: 59

## 2017-02-09 ENCOUNTER — Encounter (HOSPITAL_COMMUNITY): Payer: Self-pay | Admitting: *Deleted

## 2017-02-09 DIAGNOSIS — J45909 Unspecified asthma, uncomplicated: Secondary | ICD-10-CM | POA: Insufficient documentation

## 2017-02-09 DIAGNOSIS — M62838 Other muscle spasm: Secondary | ICD-10-CM | POA: Diagnosis not present

## 2017-02-09 DIAGNOSIS — B349 Viral infection, unspecified: Secondary | ICD-10-CM

## 2017-02-09 DIAGNOSIS — Z87891 Personal history of nicotine dependence: Secondary | ICD-10-CM | POA: Insufficient documentation

## 2017-02-09 DIAGNOSIS — R509 Fever, unspecified: Secondary | ICD-10-CM | POA: Diagnosis present

## 2017-02-09 LAB — COMPREHENSIVE METABOLIC PANEL
ALBUMIN: 3.6 g/dL (ref 3.5–5.0)
ALK PHOS: 56 U/L (ref 38–126)
ALT: 31 U/L (ref 14–54)
ANION GAP: 7 (ref 5–15)
AST: 25 U/L (ref 15–41)
BILIRUBIN TOTAL: 0.4 mg/dL (ref 0.3–1.2)
BUN: 8 mg/dL (ref 6–20)
CALCIUM: 8.4 mg/dL — AB (ref 8.9–10.3)
CO2: 23 mmol/L (ref 22–32)
Chloride: 105 mmol/L (ref 101–111)
Creatinine, Ser: 0.85 mg/dL (ref 0.44–1.00)
GFR calc Af Amer: >60 mL/min (ref >60)
GLUCOSE: 97 mg/dL (ref 65–99)
POTASSIUM: 3.8 mmol/L (ref 3.5–5.1)
Sodium: 135 mmol/L (ref 135–145)
TOTAL PROTEIN: 6.7 g/dL (ref 6.5–8.1)

## 2017-02-09 LAB — URINALYSIS, ROUTINE W REFLEX MICROSCOPIC
Bilirubin Urine: NEGATIVE
Glucose, UA: NEGATIVE mg/dL
Ketones, ur: NEGATIVE mg/dL
Leukocytes, UA: NEGATIVE
NITRITE: NEGATIVE
PROTEIN: NEGATIVE mg/dL
SPECIFIC GRAVITY, URINE: 1.023 (ref 1.005–1.030)
pH: 5 (ref 5.0–8.0)

## 2017-02-09 LAB — CBC
HCT: 42 % (ref 36.0–46.0)
HEMOGLOBIN: 14.5 g/dL (ref 12.0–15.0)
MCH: 31.3 pg (ref 26.0–34.0)
MCHC: 34.5 g/dL (ref 30.0–36.0)
MCV: 90.5 fL (ref 78.0–100.0)
Platelets: 325 10*3/uL (ref 150–400)
RBC: 4.64 MIL/uL (ref 3.87–5.11)
RDW: 12.5 % (ref 11.5–15.5)
WBC: 5.6 10*3/uL (ref 4.0–10.5)

## 2017-02-09 LAB — PREGNANCY, URINE: Preg Test, Ur: NEGATIVE

## 2017-02-09 MED ORDER — DIAZEPAM 5 MG PO TABS
5.0000 mg | ORAL_TABLET | Freq: Once | ORAL | Status: AC
  Administered 2017-02-09: 5 mg via ORAL
  Filled 2017-02-09: qty 1

## 2017-02-09 MED ORDER — IBUPROFEN 600 MG PO TABS: 600.0000 mg | ORAL_TABLET | Freq: Four times a day (QID) | ORAL | 0 refills | Status: DC | PRN

## 2017-02-09 MED ORDER — CYCLOBENZAPRINE HCL 10 MG PO TABS: 5.0000 mg | ORAL_TABLET | Freq: Two times a day (BID) | ORAL | 0 refills | Status: DC | PRN

## 2017-02-09 MED ORDER — ONDANSETRON HCL 4 MG PO TABS: 4.0000 mg | ORAL_TABLET | Freq: Four times a day (QID) | ORAL | 0 refills | Status: DC

## 2017-02-09 MED ORDER — IBUPROFEN 400 MG PO TABS
600.0000 mg | ORAL_TABLET | Freq: Once | ORAL | Status: AC
  Administered 2017-02-09: 600 mg via ORAL
  Filled 2017-02-09: qty 1

## 2017-02-09 NOTE — ED Provider Notes (Signed)
[facility] Provider Note   CSN: [id number] Arrival date & time: 02/09/17  4098   History   Chief Complaint Chief Complaint  Patient presents with  . Fever    HPI Donna Parks is a 31 y.o. female.  HPI  Patient here to the ER with hx of asthma. She came to the ER with multiple complaints. She has had a few episodes of loose sttool within the past few days. She feels like she has had temperature at home, but has measured it. It is 100 in the ER today. She has been nauseous without vomiting. Denies any abdominal pain, dysuria, vaginal bleeding, constipation. She has also had some left paraspinal cervical, and left shoulder pain. She says she gets "cricks" in her neck sometimes, this feels the same, but it is lasting longer than normal. Her neck spasm started before her episodes of loose stool. She has tried Tylenol at home. Her pain is exacerbated by movement and she feels shooting pains. NO sore throat, headache, ear pain, midline neck pain, change in vision, SOB or le swelling.  Past Medical History:  Diagnosis Date  . Asthma   . Vaginal Pap smear, abnormal     Patient Active Problem List   Diagnosis Date Noted  . Indication for care in labor or delivery 10/06/2015  . Postpartum state 10/06/2015    Past Surgical History:  Procedure Laterality Date  . TONSILLECTOMY      OB History    Gravida Para Term Preterm AB Living   SAB TAB Ectopic Multiple Live Births         0 1       Home Medications    Prior to Admission medications   Medication Sig Start Date End Date Taking? Authorizing Provider  acetaminophen (TYLENOL) 325 MG tablet Take 650 mg by mouth every 6 (six) hours as needed for headache.    Yes [provider]  albuterol (PROVENTIL HFA;VENTOLIN HFA) 108 (90 BASE) MCG/ACT inhaler Inhale 2 puffs into the lungs every 6 (six) hours as needed for wheezing or shortness of breath.    Yes [provider]  ibuprofen  (ADVIL,MOTRIN) 200 MG tablet Take 400 mg by mouth every 6 (six) hours as needed for headache or mild pain.   Yes [provider]  cyclobenzaprine (FLEXERIL) 10 MG tablet Take 0.5-1 tablets (5-10 mg total) by mouth 2 (two) times daily as needed. 02/09/17   Marlon Pel, PA-C  ibuprofen (ADVIL,MOTRIN) 600 MG tablet Take 1 tablet (600 mg total) by mouth every 6 (six) hours as needed. 02/09/17   Marlon Pel, PA-C  ondansetron (ZOFRAN) 4 MG tablet Take 1 tablet (4 mg total) by mouth every 6 (six) hours. 02/09/17   Marlon Pel, PA-C  oxycodone-acetaminophen (PERCOCET) 2.5-325 MG tablet Take 1 tablet by mouth every 4 (four) hours as needed for pain. Patient not taking: Reported on 01/12/2017 10/08/15   Levi Aland, MD  Prenatal Multivit-Min-Fe-FA (PRENATAL VITAMINS) 0.8 MG tablet Take 1 tablet by mouth daily. Patient not taking: Reported on 01/12/2017 02/04/15   Marlis Edelson, CNM    Family History Family History  Problem Relation Age of Onset  . Cancer Other   . Diabetes Other   . Hypertension Other     Social History Social History  Substance Use Topics  . Smoking status: Former Smoker    Packs/day: 0.50    Types: Cigarettes    Quit date: 05/26/2014  .  Smokeless tobacco: Never Used  . Alcohol use No     Allergies   Penicillins   Review of Systems Review of Systems The patient denies anorexia, fever, weight loss,, vision loss, decreased hearing, hoarseness, chest pain, syncope, dyspnea on exertion, peripheral edema, balance deficits, hemoptysis, abdominal pain, melena, hematochezia, severe indigestion/heartburn, hematuria, incontinence, genital sores, muscle weakness, suspicious skin lesions, transient blindness, difficulty walking, depression, unusual weight change, abnormal bleeding, enlarged lymph nodes, angioedema, and breast masses.   Physical Exam Updated Vital Signs BP 140/80   Pulse 77   Temp 99.3 F (37.4 C) (Oral)   Resp 16   SpO2 99%   Physical  Exam  Constitutional: She appears well-developed and well-nourished.  HENT:  Head: Normocephalic and atraumatic.  Eyes: Pupils are equal, round, and reactive to light. Conjunctivae are normal.  Neck: Trachea normal and full passive range of motion without pain. Neck supple. Muscular tenderness present. No spinous process tenderness present. No neck rigidity. Decreased range of motion present. No edema and no erythema present.  Cardiovascular: Normal rate, regular rhythm and normal pulses.   Pulmonary/Chest: Effort normal and breath sounds normal. Chest wall is not dull to percussion. She exhibits no tenderness, no crepitus, no edema, no deformity and no retraction.  Abdominal: Soft. Normal appearance and bowel sounds are normal. She exhibits no distension, no pulsatile liver, no fluid wave and no ascites. There is no hepatosplenomegaly. There is no tenderness. There is no rigidity, no rebound, no guarding, no CVA tenderness, no tenderness at McBurney's point and negative Murphy's sign.  Lymphadenopathy:       Head (right side): No submental, no submandibular, no tonsillar, no preauricular, no posterior auricular and no occipital adenopathy present.       Head (left side): No submental, no submandibular, no tonsillar, no preauricular, no posterior auricular and no occipital adenopathy present.    She has no cervical adenopathy.    She has no axillary adenopathy.  Neurological: She is alert. She has normal strength.  Skin: Skin is warm, dry and intact.  Psychiatric: She has a normal mood and affect. Her speech is normal and behavior is normal. Judgment and thought content normal. Cognition and memory are normal.     ED Treatments / Results  Labs (all labs ordered are listed, but only abnormal results are displayed) Labs Reviewed  COMPREHENSIVE METABOLIC PANEL - Abnormal; Notable for the following:       Result Value   Calcium 8.4 (*)    All other components within normal limits  URINALYSIS,  ROUTINE W REFLEX MICROSCOPIC - Abnormal; Notable for the following:    APPearance HAZY (*)    Hgb urine dipstick LARGE (*)    Bacteria, UA RARE (*)    Squamous Epithelial / LPF 6-30 (*)    All other components within normal limits  CBC  PREGNANCY, URINE    EKG  EKG Interpretation  Date/Time:  Monday February 09 2017 13:13:16 EDT Ventricular Rate:  86 PR Interval:    QRS Duration: 106 QT Interval:  356 QTC Calculation: 426 R Axis:   25 Text Interpretation:  Sinus rhythm Low voltage, precordial leads Non-specific intra-ventricular conduction delay Otherwise within normal limits Confirmed by Gerhard Munch [phone]) on 02/09/2017 1:17:45 PM Also confirmed by Gerhard Munch ([id number]), editor Barbette Hair [phone])  on 02/09/2017 1:36:56 PM       Radiology Dg Chest 2 View  Result Date: 02/09/2017 CLINICAL DATA:  Left chest pain EXAM: CHEST  2 VIEW COMPARISON:  None.  FINDINGS: Lungs are clear.  No pleural effusion or pneumothorax. The heart is normal in size. Visualized osseous structures are within normal limits. IMPRESSION: Normal chest radiographs. Electronically Signed   By: Charline Bills M.D.   On: 02/09/2017 13:28    Procedures Procedures (including critical care time)  Medications Ordered in ED Medications  diazepam (VALIUM) tablet 5 mg (not administered)  ibuprofen (ADVIL,MOTRIN) tablet 600 mg (not administered)     Initial Impression / Assessment and Plan / ED Course  I have reviewed the triage vital signs and the nursing notes.  Pertinent labs & imaging results that were available during my care of the patient were reviewed by me and considered in my medical decision making (see chart for details).  Clinical Course as of Feb 10 1440  Mon Feb 09, 2017  1402 UA shows large Hgb,otherwise nml. Calcium mildly low Normal CBC. NSR on EKG, nml chest xray NO pregnancy test noted. Will add that on. Suspect patients symptoms are related to a virus and muscle spasms.  [TG]    1438 Negative pregnancy test. Will discharge with Flexeril, Zofran and Motrin. Strict return precautions given.  [TG]    Clinical Course User Index [TG] Marlon Pel, PA-C   Blood pressure 140/80, pulse 77, temperature 99.3 F (37.4 C), temperature source Oral, resp. rate 16, SpO2 99 %, not currently breastfeeding.  SHUNTAY EVERETTS has been evaluated today in the emergency department. The appropriate screening and testing was been performed and I believe the patient to be medically stable for discharge.   Return signs and symptoms have been discussed with the patient and/or caregivers and they have voiced their understanding. The patient has agreed to follow-up with their primary care provider or the referred specialist.      Final Clinical Impressions(s) / ED Diagnoses   Final diagnoses:  Muscle spasm  Nonspecific syndrome suggestive of viral illness    New Prescriptions New Prescriptions   CYCLOBENZAPRINE (FLEXERIL) 10 MG TABLET    Take 0.5-1 tablets (5-10 mg total) by mouth 2 (two) times daily as needed.   IBUPROFEN (ADVIL,MOTRIN) 600 MG TABLET    Take 1 tablet (600 mg total) by mouth every 6 (six) hours as needed.   ONDANSETRON (ZOFRAN) 4 MG TABLET    Take 1 tablet (4 mg total) by mouth every 6 (six) hours.     Marlon Pel, PA-C 02/09/17 1442    Gerhard Munch, MD 02/09/17 [phone]

## 2017-02-09 NOTE — ED Triage Notes (Signed)
To ED for eval of left should pain/left neck pain/upper back pain/fever/nausea/vomiting since yesterday. States pain is worse in left arm/shoulder with any movement. Appears in nad.

## 2017-11-03 ENCOUNTER — Encounter (HOSPITAL_COMMUNITY): Payer: Self-pay | Admitting: Emergency Medicine

## 2017-11-03 ENCOUNTER — Other Ambulatory Visit: Payer: Self-pay

## 2017-11-03 ENCOUNTER — Ambulatory Visit (HOSPITAL_COMMUNITY)
Admission: EM | Admit: 2017-11-03 | Discharge: 2017-11-03 | Disposition: A | Discharge status: Home / Self Care | Payer: 59 | Attending: Family Medicine | Admitting: Family Medicine

## 2017-11-03 DIAGNOSIS — J020 Streptococcal pharyngitis: Secondary | ICD-10-CM

## 2017-11-03 MED ORDER — CEPHALEXIN 500 MG PO CAPS: 500.0000 mg | ORAL_CAPSULE | Freq: Three times a day (TID) | ORAL | 0 refills | Status: DC

## 2017-11-03 NOTE — ED Triage Notes (Signed)
The patient presented to the [facility] with a complaint of a sore throat x 1 day. 

## 2017-11-03 NOTE — ED Provider Notes (Signed)
[facility]    CSN: [id number] Arrival date & time: 11/03/17  1803     History   Chief Complaint Chief Complaint  Patient presents with  . Sore Throat    HPI Donna Parks is a 32 y.o. female.   HPI  Patient's daughter just recovered from pharyngitis.  She was treated with amoxicillin.  Unknown if she had strep throat.  Patient developed a sore throat yesterday.  It was painful to swallow.  She works at a [employer].  A rapid strep was positive.  She is here today for treatment.  She has a painful throat, low-grade temperature, some nasal congestion but no runny or stuffy nose.  No coughing.  Mild headache.  She feels very tired.  Past Medical History:  Diagnosis Date  . Asthma   . Vaginal Pap smear, abnormal     Patient Active Problem List   Diagnosis Date Noted  . Indication for care in labor or delivery 10/06/2015  . Postpartum state 10/06/2015    Past Surgical History:  Procedure Laterality Date  . TONSILLECTOMY      OB History    Gravida  3   Para  3   Term  3   Preterm      AB      Living  1     SAB      TAB      Ectopic      Multiple  0   Live Births  1            Home Medications    Prior to Admission medications   Medication Sig Start Date End Date Taking? Authorizing Provider  acetaminophen (TYLENOL) 325 MG tablet Take 650 mg by mouth every 6 (six) hours as needed for headache.    Yes [provider]  cephALEXin (KEFLEX) 500 MG capsule Take 1 capsule (500 mg total) by mouth 3 (three) times daily. 11/03/17   Eustace Moore, MD    Family History Family History  Problem Relation Age of Onset  . Cancer Other   . Diabetes Other   . Hypertension Other     Social History Social History   Tobacco Use  . Smoking status: Former Smoker    Packs/day: 0.50    Types: Cigarettes    Last attempt to quit: 05/26/2014    Years since quitting: 3.4  . Smokeless tobacco: Never Used  Substance Use Topics  .  Alcohol use: No  . Drug use: No     Allergies   Penicillins   Review of Systems Review of Systems  Constitutional: Positive for fatigue and fever. Negative for chills.  HENT: Positive for sore throat. Negative for ear pain.   Eyes: Negative for pain and visual disturbance.  Respiratory: Negative for cough and shortness of breath.   Cardiovascular: Negative for chest pain and palpitations.  Gastrointestinal: Negative for abdominal pain and vomiting.  Genitourinary: Negative for dysuria and hematuria.  Musculoskeletal: Negative for arthralgias and back pain.  Skin: Negative for color change and rash.  Neurological: Negative for seizures and syncope.  All other systems reviewed and are negative.    Physical Exam Triage Vital Signs ED Triage Vitals [11/03/17 1933]  Enc Vitals Group     BP 137/82     Pulse Rate 73     Resp 18     Temp 98.7 F (37.1 C)     Temp Source Oral     SpO2 98 %  Weight      Height      Head Circumference      Peak Flow      Pain Score 8     Pain Loc      Pain Edu?      Excl. in GC?    No data found.  Updated Vital Signs BP 137/82 (BP Location: Right Arm)   Pulse 73   Temp 98.7 F (37.1 C) (Oral)   Resp 18   SpO2 98%   Visual Acuity Right Eye Distance:   Left Eye Distance:   Bilateral Distance:    Right Eye Near:   Left Eye Near:    Bilateral Near:     Physical Exam  Constitutional: She appears well-developed and well-nourished. No distress.  HENT:  Head: Normocephalic and atraumatic.  Mouth/Throat: Mucous membranes are normal. Posterior oropharyngeal edema and posterior oropharyngeal erythema present. Tonsils are 1+ on the right. Tonsils are 1+ on the left. No tonsillar exudate.  Eyes: Pupils are equal, round, and reactive to light. Conjunctivae are normal.  Neck: Normal range of motion.  Cardiovascular: Normal rate.  Pulmonary/Chest: Effort normal. No respiratory distress.  Abdominal: Soft. She exhibits no distension.    Musculoskeletal: Normal range of motion. She exhibits no edema.  Lymphadenopathy:    She has cervical adenopathy.  Neurological: She is alert.  Skin: Skin is warm and dry.     UC Treatments / Results  Labs (all labs ordered are listed, but only abnormal results are displayed) Labs Reviewed - No data to display  EKG None  Radiology No results found.  Procedures Procedures (including critical care time)  Medications Ordered in UC Medications - No data to display  Initial Impression / Assessment and Plan / UC Course  I have reviewed the triage vital signs and the nursing notes.  Pertinent labs & imaging results that were available during my care of the patient were reviewed by me and considered in my medical decision making (see chart for details).     Strep test is positive per patient.  She is reliable.  We will treat with Keflex.  She was allergic to penicillin as a child.  No serious reaction. Final Clinical Impressions(s) / UC Diagnoses   Final diagnoses:  Strep throat     Discharge Instructions     Push fluids Ibuprofen or tylenol for pain Throat lozenges for pain Take the antibiotic for 10 days   ED Prescriptions    Medication Sig Dispense Auth. Provider   cephALEXin (KEFLEX) 500 MG capsule Take 1 capsule (500 mg total) by mouth 3 (three) times daily. 30 capsule Eustace MooreNelson, Glenford Garis Sue, MD     Controlled Substance Prescriptions [place] Controlled Substance Registry consulted? Not Applicable   Eustace MooreNelson, Oliviagrace Crisanti Sue, MD 11/03/17 2001

## 2017-11-03 NOTE — Discharge Instructions (Signed)
Push fluids Ibuprofen or tylenol for pain Throat lozenges for pain Take the antibiotic for 10 days

## 2018-07-14 ENCOUNTER — Encounter (HOSPITAL_COMMUNITY): Payer: Self-pay | Admitting: Emergency Medicine

## 2018-07-14 ENCOUNTER — Ambulatory Visit (HOSPITAL_COMMUNITY)
Admission: EM | Admit: 2018-07-14 | Discharge: 2018-07-14 | Disposition: A | Discharge status: Home / Self Care | Payer: 59 | Attending: Family Medicine | Admitting: Family Medicine

## 2018-07-14 DIAGNOSIS — R69 Illness, unspecified: Secondary | ICD-10-CM

## 2018-07-14 DIAGNOSIS — J111 Influenza due to unidentified influenza virus with other respiratory manifestations: Secondary | ICD-10-CM

## 2018-07-14 MED ORDER — OSELTAMIVIR PHOSPHATE 75 MG PO CAPS: 75.0000 mg | ORAL_CAPSULE | Freq: Two times a day (BID) | ORAL | 0 refills | Status: DC

## 2018-07-14 MED ORDER — BENZONATATE 100 MG PO CAPS: 100.0000 mg | ORAL_CAPSULE | Freq: Three times a day (TID) | ORAL | 0 refills | Status: DC

## 2018-07-14 MED ORDER — CETIRIZINE HCL 10 MG PO CHEW: 10.0000 mg | CHEWABLE_TABLET | Freq: Every day | ORAL | 0 refills | Status: DC

## 2018-07-14 MED ORDER — FLUTICASONE PROPIONATE 50 MCG/ACT NA SUSP: 2.0000 | Freq: Every day | NASAL | 0 refills | Status: DC

## 2018-07-14 NOTE — ED Notes (Signed)
Patient able to ambulate independently  

## 2018-07-14 NOTE — ED Provider Notes (Signed)
[facility]   [id number] 07/14/18 Arrival Time: 1900   CC: URI symptoms   SUBJECTIVE: History from: patient.  Donna Parks is a 33 y.o. female who presents with abrupt onset of nasal congestion, runny nose, cough, body aches, fatigue, subjective fever and chills x 2 days.  Denies known sick exposure or precipitating event.  Has NOT tried OTC medications.  Denies aggravating factors. Reports previous symptoms in the past.   Denies sore throat, SOB, wheezing, chest pain, nausea, changes in bowel or bladder habits.    Received flu shot this year: no.  ROS: As per HPI.  Past Medical History:  Diagnosis Date  . Asthma   . Vaginal Pap smear, abnormal    Past Surgical History:  Procedure Laterality Date  . TONSILLECTOMY     Allergies  Allergen Reactions  . Penicillins Hives and Other (See Comments)    Has patient had a PCN reaction causing immediate rash, facial/tongue/throat swelling, SOB or lightheadedness with hypotension: No Has patient had a PCN reaction causing severe rash involving mucus membranes or skin necrosis: No Has patient had a PCN reaction that required hospitalization No Has patient had a PCN reaction occurring within the last 10 years: No If all of the above answers are "NO", then may proceed with Cephalosporin use.   No current facility-administered medications on file prior to encounter.    Current Outpatient Medications on File Prior to Encounter  Medication Sig Dispense Refill  . topiramate (TOPAMAX) 25 MG tablet TK 1 T PO Q NIGHT FOR 1 WK THEN TK 1 T PO BID     Social History   Socioeconomic History  . Marital status: Single    Spouse name: Not on file  . Number of children: Not on file  . Years of education: Not on file  . Highest education level: Not on file  Occupational History  . Not on file  Social Needs  . Financial resource strain: Not on file  . Food insecurity:    Worry: Not on file    Inability: Not on file  .  Transportation needs:    Medical: Not on file    Non-medical: Not on file  Tobacco Use  . Smoking status: Former Smoker    Packs/day: 0.50    Types: Cigarettes    Last attempt to quit: 05/26/2014    Years since quitting: 4.1  . Smokeless tobacco: Never Used  Substance and Sexual Activity  . Alcohol use: No  . Drug use: No  . Sexual activity: Not Currently    Birth control/protection: None  Lifestyle  . Physical activity:    Days per week: Not on file    Minutes per session: Not on file  . Stress: Not on file  Relationships  . Social connections:    Talks on phone: Not on file    Gets together: Not on file    Attends religious service: Not on file    Active member of club or organization: Not on file    Attends meetings of clubs or organizations: Not on file    Relationship status: Not on file  . Intimate partner violence:    Fear of current or ex partner: Not on file    Emotionally abused: Not on file    Physically abused: Not on file    Forced sexual activity: Not on file  Other Topics Concern  . Not on file  Social History Narrative  . Not on file   Family  History  Problem Relation Age of Onset  . Cancer Other   . Diabetes Other   . Hypertension Other     OBJECTIVE:  Vitals:   07/14/18 1937  BP: (!) 134/92  Pulse: 84  Resp: 18  Temp: 98.7 F (37.1 C)  TempSrc: Oral  SpO2: 100%     General appearance: alert; appears fatigued, but nontoxic; speaking in full sentences and tolerating own secretions HEENT: NCAT; Ears: EACs clear, TMs pearly gray; Eyes: PERRL.  EOM grossly intact. Sinuses: nontender; Nose: nares patent without rhinorrhea, turbinates swollen and erythematous, Throat: oropharynx clear, tonsils non erythematous or enlarged, uvula midline  Neck: supple without LAD Lungs: unlabored respirations, symmetrical air entry; cough: mild; no respiratory distress; CTAB Heart: regular rate and rhythm.  Radial pulses 2+ symmetrical bilaterally Skin: warm and  dry Psychological: alert and cooperative; normal mood and affect  ASSESSMENT & PLAN:  1. Influenza-like illness     Meds ordered this encounter  Medications  . benzonatate (TESSALON) 100 MG capsule    Sig: Take 1 capsule (100 mg total) by mouth every 8 (eight) hours.    Dispense:  21 capsule    Refill:  0    Order Specific Question:   Supervising Provider    Answer:   Eustace Moore [[id number]]  . oseltamivir (TAMIFLU) 75 MG capsule    Sig: Take 1 capsule (75 mg total) by mouth every 12 (twelve) hours.    Dispense:  10 capsule    Refill:  0    Order Specific Question:   Supervising Provider    Answer:   Eustace Moore [[id number]]  . cetirizine (ZYRTEC) 10 MG chewable tablet    Sig: Chew 1 tablet (10 mg total) by mouth daily.    Dispense:  20 tablet    Refill:  0    Order Specific Question:   Supervising Provider    Answer:   Eustace Moore [[id number]]  . fluticasone (FLONASE) 50 MCG/ACT nasal spray    Sig: Place 2 sprays into both nostrils daily.    Dispense:  16 g    Refill:  0    Order Specific Question:   Supervising Provider    Answer:   Eustace Moore [[id number]]    Get plenty of rest and push fluids.  Supplement with pedialyte or OTC oral rehydration soluation Tessalon Perles prescribed for cough Zyrtec-D prescribed for nasal congestion, runny nose, and/or sore throat Flonase prescribed for nasal congestion and runny nose Use medications daily for symptom relief Use OTC medications like ibuprofen or tylenol as needed fever or pain Tamiflu prescribed.   Take as directed and to completion Follow up with PCP or with [facility] if symptoms persist Return or go to ER if you have any new or worsening symptoms fever, chills, nausea, vomiting, chest pain, cough, shortness of breath, wheezing, abdominal pain, changes in bowel or bladder habits, etc...  Reviewed expectations re: course of current medical issues. Questions answered. Outlined signs and symptoms  indicating need for more acute intervention. Patient verbalized understanding. After Visit Summary given.         Rennis Harding, PA-C 07/14/18 1958

## 2018-07-14 NOTE — ED Triage Notes (Signed)
Pt c/o cough, congestion, facial pain for several days.

## 2018-07-14 NOTE — Discharge Instructions (Signed)
Get plenty of rest and push fluids.  Supplement with pedialyte or OTC oral rehydration soluation Tessalon Perles prescribed for cough Zyrtec-D prescribed for nasal congestion, runny nose, and/or sore throat Flonase prescribed for nasal congestion and runny nose Use medications daily for symptom relief Use OTC medications like ibuprofen or tylenol as needed fever or pain Tamiflu prescribed.   Take as directed and to completion Follow up with PCP or with [facility] if symptoms persist Return or go to ER if you have any new or worsening symptoms fever, chills, nausea, vomiting, chest pain, cough, shortness of breath, wheezing, abdominal pain, changes in bowel or bladder habits, etc..Marland Kitchen

## 2018-08-06 ENCOUNTER — Ambulatory Visit (HOSPITAL_COMMUNITY)
Admission: EM | Admit: 2018-08-06 | Discharge: 2018-08-06 | Disposition: A | Discharge status: Home / Self Care | Payer: 59

## 2018-08-06 ENCOUNTER — Encounter (HOSPITAL_COMMUNITY): Payer: Self-pay

## 2018-08-06 ENCOUNTER — Other Ambulatory Visit: Payer: Self-pay

## 2018-08-06 DIAGNOSIS — J00 Acute nasopharyngitis [common cold]: Secondary | ICD-10-CM | POA: Diagnosis not present

## 2018-08-06 NOTE — ED Triage Notes (Signed)
Pt present cough, nasal congestion with sore throat. Symptoms  Started three days ago.

## 2018-08-06 NOTE — Discharge Instructions (Signed)
Throat lozenges, gargles, chloraseptic spray, warm teas, popsicles etc to help with throat pain.   Tylenol and/or ibuprofen as needed for pain or fevers.   Push fluids to ensure adequate hydration and keep secretions thin.  Over the counter treatments as needed for symptoms.  If symptoms worsen or do not improve in the next week to return to be seen or to follow up with your PCP.

## 2018-08-06 NOTE — ED Provider Notes (Signed)
[facility]    CSN: [id number] Arrival date & time: 08/06/18  1552     History   Chief Complaint Chief Complaint  Patient presents with  . Sore Throat  . Cough  . Nasal Congestion    HPI Donna Parks is a 33 y.o. female.   Donna Parks presents with complaints of nasal congestion and sore throat. Mild occasional cough. Seems started three days ago. Feels like she may have had a fever the first day. No fevers since but has been taking ibuprofen regularly. Nausea, no vomiting. No rash. No known ill contacts. No other medications for symptoms. Hx of tonsillectomy. Hx of asthma.    ROS per HPI, negative if not otherwise mentioned.      Past Medical History:  Diagnosis Date  . Asthma   . Vaginal Pap smear, abnormal     Patient Active Problem List   Diagnosis Date Noted  . Indication for care in labor or delivery 10/06/2015  . Postpartum state 10/06/2015    Past Surgical History:  Procedure Laterality Date  . TONSILLECTOMY      OB History    Gravida  3   Para  3   Term  3   Preterm      AB      Living  1     SAB      TAB      Ectopic      Multiple  0   Live Births  1            Home Medications    Prior to Admission medications   Medication Sig Start Date End Date Taking? Authorizing Provider  benzonatate (TESSALON) 100 MG capsule Take 1 capsule (100 mg total) by mouth every 8 (eight) hours. 07/14/18   Wurst, [place], PA-C  cetirizine (ZYRTEC) 10 MG chewable tablet Chew 1 tablet (10 mg total) by mouth daily. 07/14/18   Wurst, [place], PA-C  fluticasone (FLONASE) 50 MCG/ACT nasal spray Place 2 sprays into both nostrils daily. 07/14/18   Wurst, [place], PA-C  oseltamivir (TAMIFLU) 75 MG capsule Take 1 capsule (75 mg total) by mouth every 12 (twelve) hours. 07/14/18   Wurst, [place], PA-C  topiramate (TOPAMAX) 25 MG tablet TK 1 T PO Q NIGHT FOR 1 WK THEN TK 1 T PO BID 06/03/18   [provider]    Family History Family  History  Problem Relation Age of Onset  . Cancer Other   . Diabetes Other   . Hypertension Other     Social History Social History   Tobacco Use  . Smoking status: Former Smoker    Packs/day: 0.50    Types: Cigarettes    Last attempt to quit: 05/26/2014    Years since quitting: 4.2  . Smokeless tobacco: Never Used  Substance Use Topics  . Alcohol use: No  . Drug use: No     Allergies   Penicillins   Review of Systems Review of Systems   Physical Exam Triage Vital Signs ED Triage Vitals  Enc Vitals Group     BP 08/06/18 1651 122/80     Pulse Rate 08/06/18 1651 70     Resp 08/06/18 1651 18     Temp 08/06/18 1651 98.1 F (36.7 C)     Temp Source 08/06/18 1651 Oral     SpO2 08/06/18 1651 100 %     Weight --      Height --      Head Circumference --  Peak Flow --      Pain Score 08/06/18 1653 10     Pain Loc --      Pain Edu? --      Excl. in GC? --    No data found.  Updated Vital Signs BP 122/80 (BP Location: Right Arm)   Pulse 70   Temp 98.1 F (36.7 C) (Oral)   Resp 18   LMP 07/20/2018   SpO2 100%   Physical Exam Constitutional:      General: She is not in acute distress.    Appearance: She is well-developed.  HENT:     Head: Normocephalic and atraumatic.     Right Ear: Tympanic membrane, ear canal and external ear normal.     Left Ear: Tympanic membrane, ear canal and external ear normal.     Nose: Nose normal.     Mouth/Throat:     Mouth: No oral lesions.     Pharynx: Uvula midline. Posterior oropharyngeal erythema present. No oropharyngeal exudate.     Tonsils: No tonsillar exudate. Swelling: 0 on the right. 0 on the left.  Eyes:     Conjunctiva/sclera: Conjunctivae normal.     Pupils: Pupils are equal, round, and reactive to light.  Cardiovascular:     Rate and Rhythm: Normal rate and regular rhythm.     Heart sounds: Normal heart sounds.  Pulmonary:     Effort: Pulmonary effort is normal.     Breath sounds: Normal breath sounds.   Lymphadenopathy:     Cervical: No cervical adenopathy.  Skin:    General: Skin is warm and dry.  Neurological:     Mental Status: She is alert and oriented to person, place, and time.      UC Treatments / Results  Labs (all labs ordered are listed, but only abnormal results are displayed) Labs Reviewed - No data to display  EKG None  Radiology No results found.  Procedures Procedures (including critical care time)  Medications Ordered in UC Medications - No data to display  Initial Impression / Assessment and Plan / UC Course  I have reviewed the triage vital signs and the nursing notes.  Pertinent labs & imaging results that were available during my care of the patient were reviewed by me and considered in my medical decision making (see chart for details).     Non toxic, afebrile. Benign physical exam. History and physical consistent with viral illness.  Supportive cares recommended. If symptoms worsen or do not improve in the next week to return to be seen or to follow up with PCP.  Patient verbalized understanding and agreeable to plan.   Final Clinical Impressions(s) / UC Diagnoses   Final diagnoses:  Acute nasopharyngitis     Discharge Instructions     Throat lozenges, gargles, chloraseptic spray, warm teas, popsicles etc to help with throat pain.   Tylenol and/or ibuprofen as needed for pain or fevers.   Push fluids to ensure adequate hydration and keep secretions thin.  Over the counter treatments as needed for symptoms.  If symptoms worsen or do not improve in the next week to return to be seen or to follow up with your PCP.     ED Prescriptions    None     Controlled Substance Prescriptions [place] Controlled Substance Registry consulted? Not Applicable   Georgetta Haber, NP 08/06/18 1751

## 2019-11-21 ENCOUNTER — Encounter (HOSPITAL_COMMUNITY): Payer: Self-pay

## 2019-11-21 ENCOUNTER — Ambulatory Visit (INDEPENDENT_AMBULATORY_CARE_PROVIDER_SITE_OTHER): Payer: 59

## 2019-11-21 ENCOUNTER — Ambulatory Visit (HOSPITAL_COMMUNITY)
Admission: EM | Admit: 2019-11-21 | Discharge: 2019-11-21 | Disposition: A | Discharge status: Home / Self Care | Payer: 59 | Attending: Internal Medicine | Admitting: Internal Medicine

## 2019-11-21 DIAGNOSIS — J209 Acute bronchitis, unspecified: Secondary | ICD-10-CM

## 2019-11-21 DIAGNOSIS — R05 Cough: Secondary | ICD-10-CM

## 2019-11-21 DIAGNOSIS — R093 Abnormal sputum: Secondary | ICD-10-CM | POA: Diagnosis not present

## 2019-11-21 DIAGNOSIS — Z20822 Contact with and (suspected) exposure to covid-19: Secondary | ICD-10-CM | POA: Insufficient documentation

## 2019-11-21 MED ORDER — MUCINEX 600 MG PO TB12
600.0000 mg | ORAL_TABLET | Freq: Two times a day (BID) | ORAL | 0 refills | Status: AC
Start: 2019-11-21 — End: 2019-11-28

## 2019-11-21 MED ORDER — BENZONATATE 100 MG PO CAPS: 100.0000 mg | ORAL_CAPSULE | Freq: Three times a day (TID) | ORAL | 0 refills | Status: DC

## 2019-11-21 NOTE — ED Triage Notes (Signed)
Pt presents with complaints of cough, fever, congestion and body aches x 4 days. Pt has not been vaccinated for covid or in known close exposure.

## 2019-11-21 NOTE — ED Provider Notes (Signed)
[place]    CSN: [id number] Arrival date & time: 11/21/19  7371      History   Chief Complaint Chief Complaint  Patient presents with  . Cough    HPI Donna Parks is a 34 y.o. female comes to the urgent care with a 4-day history of fever (100.8 Fahrenheit), congestion, generalized body aches and a cough productive of greenish sputum.  Symptoms started 4 days ago and as pain progressively worse.  Patient has chest pain associated with coughing.  No nausea, vomiting, loss of taste or smell.  No wheezing.  No sick contacts.  Patient is not vaccinated against COVID-19 virus.  No dizziness.  Patient works in a [employer].   Patient smokes cigarettes.  She smokes about 2 cigarettes daily.  HPI  Past Medical History:  Diagnosis Date  . Asthma   . Vaginal Pap smear, abnormal     Patient Active Problem List   Diagnosis Date Noted  . Indication for care in labor or delivery 10/06/2015  . Postpartum state 10/06/2015    Past Surgical History:  Procedure Laterality Date  . TONSILLECTOMY      OB History    Gravida  3   Para  3   Term  3   Preterm      AB      Living  1     SAB      TAB      Ectopic      Multiple  0   Live Births  1            Home Medications    Prior to Admission medications   Medication Sig Start Date End Date Taking? Authorizing Provider  topiramate (TOPAMAX) 25 MG tablet TK 1 T PO Q NIGHT FOR 1 WK THEN TK 1 T PO BID 06/03/18  Yes [provider]  benzonatate (TESSALON) 100 MG capsule Take 1 capsule (100 mg total) by mouth every 8 (eight) hours. 11/21/19   Alfonzia Woolum, Myrene Galas, MD  guaiFENesin (MUCINEX) 600 MG 12 hr tablet Take 1 tablet (600 mg total) by mouth 2 (two) times daily for 7 days. 11/21/19 11/28/19  Chase Picket, MD  cetirizine (ZYRTEC) 10 MG chewable tablet Chew 1 tablet (10 mg total) by mouth daily. 07/14/18 11/21/19  Wurst, [place], PA-C  fluticasone (FLONASE) 50 MCG/ACT nasal spray Place 2 sprays  into both nostrils daily. 07/14/18 11/21/19  Lestine Box, PA-C    Family History Family History  Problem Relation Age of Onset  . Cancer Other   . Diabetes Other   . Hypertension Other   . Diabetes Mother   . Healthy Father     Social History Social History   Tobacco Use  . Smoking status: Former Smoker    Packs/day: 0.50    Types: Cigarettes    Quit date: 05/26/2014    Years since quitting: 5.4  . Smokeless tobacco: Never Used  Substance Use Topics  . Alcohol use: No  . Drug use: No     Allergies   Penicillins   Review of Systems Review of Systems  Constitutional: Positive for activity change and fever. Negative for chills and fatigue.  HENT: Positive for congestion. Negative for ear discharge, ear pain and sore throat.   Respiratory: Positive for cough. Negative for shortness of breath and wheezing.   Cardiovascular: Negative for chest pain and palpitations.  Gastrointestinal: Negative for diarrhea, nausea and vomiting.  Genitourinary: Negative.   Neurological: Negative.  Physical Exam Triage Vital Signs ED Triage Vitals  Enc Vitals Group     BP 11/21/19 0917 133/88     Pulse Rate 11/21/19 0917 85     Resp 11/21/19 0917 18     Temp 11/21/19 0917 98.6 F (37 C)     Temp src --      SpO2 11/21/19 0917 97 %     Weight --      Height --      Head Circumference --      Peak Flow --      Pain Score 11/21/19 0914 0     Pain Loc --      Pain Edu? --      Excl. in GC? --    No data found.  Updated Vital Signs BP 133/88   Pulse 85   Temp 98.6 F (37 C)   Resp 18   LMP 11/07/2019 (Exact Date)   SpO2 97%   Visual Acuity Right Eye Distance:   Left Eye Distance:   Bilateral Distance:    Right Eye Near:   Left Eye Near:    Bilateral Near:     Physical Exam Vitals and nursing note reviewed.  Constitutional:      General: She is not in acute distress.    Appearance: She is not ill-appearing.  HENT:     Right Ear: Tympanic membrane  normal.     Left Ear: Tympanic membrane normal.     Mouth/Throat:     Mouth: Mucous membranes are moist.     Pharynx: No oropharyngeal exudate or posterior oropharyngeal erythema.  Cardiovascular:     Rate and Rhythm: Normal rate and regular rhythm.     Pulses: Normal pulses.     Heart sounds: Normal heart sounds.  Pulmonary:     Effort: Pulmonary effort is normal. No respiratory distress.     Breath sounds: No stridor. Rhonchi present. No rales.  Abdominal:     General: Bowel sounds are normal.     Palpations: Abdomen is soft.  Musculoskeletal:        General: Normal range of motion.     Cervical back: Normal range of motion and neck supple. No rigidity or tenderness.  Neurological:     Mental Status: She is alert.      UC Treatments / Results  Labs (all labs ordered are listed, but only abnormal results are displayed) Labs Reviewed  SARS CORONAVIRUS 2 (TAT 6-24 HRS)    EKG   Radiology DG Chest 2 View  Result Date: 11/21/2019 CLINICAL DATA:  Productive cough with green sputum EXAM: CHEST - 2 VIEW COMPARISON:  02/09/2017 FINDINGS: The heart size and mediastinal contours are within normal limits. Both lungs are clear. The visualized skeletal structures are unremarkable. IMPRESSION: Negative chest. Electronically Signed   By: Marnee Spring M.D.   On: 11/21/2019 10:24    Procedures Procedures (including critical care time)  Medications Ordered in UC Medications - No data to display  Initial Impression / Assessment and Plan / UC Course  I have reviewed the triage vital signs and the nursing notes.  Pertinent labs & imaging results that were available during my care of the patient were reviewed by me and considered in my medical decision making (see chart for details).     1.  Acute bronchitis: Chest x-ray was negative for any acute lung infiltrate. COVID-19 PCR test has been sent. Tessalon Perles as needed for cough Smoke cessation advice given.  Patient is in  the precontemplative stage of smoke cessation.  Time spent on smoke cessation counseling is less than 10 minutes Return precautions given Patient is advised to self quarantine until COVID-19 test results are available.  COVID-19 vaccination is recommended.  Final Clinical Impressions(s) / UC Diagnoses   Final diagnoses:  Acute bronchitis, unspecified organism   Discharge Instructions   None    ED Prescriptions    Medication Sig Dispense Auth. Provider   benzonatate (TESSALON) 100 MG capsule Take 1 capsule (100 mg total) by mouth every 8 (eight) hours. 30 capsule Jariel Drost, Britta Mccreedy, MD   guaiFENesin (MUCINEX) 600 MG 12 hr tablet Take 1 tablet (600 mg total) by mouth 2 (two) times daily for 7 days. 14 tablet Loretta Doutt, Britta Mccreedy, MD     PDMP not reviewed this encounter.   Merrilee Jansky, MD 11/21/19 1028

## 2019-11-22 LAB — SARS CORONAVIRUS 2 (TAT 6-24 HRS): SARS Coronavirus 2: NEGATIVE

## 2020-02-11 ENCOUNTER — Ambulatory Visit (HOSPITAL_COMMUNITY)
Admission: EM | Admit: 2020-02-11 | Discharge: 2020-02-11 | Disposition: A | Discharge status: Home / Self Care | Payer: 59

## 2020-02-11 ENCOUNTER — Other Ambulatory Visit: Payer: Self-pay

## 2020-02-11 NOTE — ED Notes (Signed)
Left without being seen due to wait time.

## 2020-04-06 ENCOUNTER — Other Ambulatory Visit: Payer: Self-pay | Admitting: Physician Assistant

## 2020-04-06 DIAGNOSIS — N644 Mastodynia: Secondary | ICD-10-CM

## 2020-04-09 ENCOUNTER — Ambulatory Visit
Admission: RE | Admit: 2020-04-09 | Discharge: 2020-04-09 | Disposition: A | Discharge status: Home / Self Care | Payer: 59 | Source: Ambulatory Visit | Attending: Physician Assistant | Admitting: Physician Assistant

## 2020-04-09 ENCOUNTER — Other Ambulatory Visit: Payer: Self-pay

## 2020-04-09 DIAGNOSIS — N644 Mastodynia: Secondary | ICD-10-CM

## 2020-04-13 ENCOUNTER — Other Ambulatory Visit: Payer: Self-pay | Admitting: Internal Medicine

## 2020-04-16 ENCOUNTER — Other Ambulatory Visit: Payer: Self-pay | Admitting: Internal Medicine

## 2020-06-18 ENCOUNTER — Other Ambulatory Visit (HOSPITAL_BASED_OUTPATIENT_CLINIC_OR_DEPARTMENT_OTHER): Payer: Self-pay

## 2020-06-18 DIAGNOSIS — R0683 Snoring: Secondary | ICD-10-CM

## 2020-06-18 DIAGNOSIS — G4710 Hypersomnia, unspecified: Secondary | ICD-10-CM

## 2020-06-18 DIAGNOSIS — R5383 Other fatigue: Secondary | ICD-10-CM

## 2020-07-10 ENCOUNTER — Encounter (HOSPITAL_BASED_OUTPATIENT_CLINIC_OR_DEPARTMENT_OTHER): Payer: 59 | Admitting: Internal Medicine

## 2020-12-21 ENCOUNTER — Emergency Department (HOSPITAL_COMMUNITY)
Admission: EM | Admit: 2020-12-21 | Discharge: 2020-12-21 | Disposition: A | Discharge status: Home / Self Care | Payer: 59 | Attending: Emergency Medicine | Admitting: Emergency Medicine

## 2020-12-21 ENCOUNTER — Other Ambulatory Visit: Payer: Self-pay

## 2020-12-21 DIAGNOSIS — L299 Pruritus, unspecified: Secondary | ICD-10-CM

## 2020-12-21 DIAGNOSIS — J45909 Unspecified asthma, uncomplicated: Secondary | ICD-10-CM | POA: Diagnosis not present

## 2020-12-21 DIAGNOSIS — D72829 Elevated white blood cell count, unspecified: Secondary | ICD-10-CM | POA: Diagnosis not present

## 2020-12-21 DIAGNOSIS — Z87891 Personal history of nicotine dependence: Secondary | ICD-10-CM | POA: Insufficient documentation

## 2020-12-21 LAB — CBC WITH DIFFERENTIAL/PLATELET
Abs Immature Granulocytes: 0.08 10*3/uL — ABNORMAL HIGH (ref 0.00–0.07)
Basophils Absolute: 0.1 10*3/uL (ref 0.0–0.1)
Basophils Relative: 0 %
Eosinophils Absolute: 0 10*3/uL (ref 0.0–0.5)
Eosinophils Relative: 0 %
HCT: 44.7 % (ref 36.0–46.0)
Hemoglobin: 15.4 g/dL — ABNORMAL HIGH (ref 12.0–15.0)
Immature Granulocytes: 1 %
Lymphocytes Relative: 12 %
Lymphs Abs: 1.7 10*3/uL (ref 0.7–4.0)
MCH: 32.7 pg (ref 26.0–34.0)
MCHC: 34.5 g/dL (ref 30.0–36.0)
MCV: 94.9 fL (ref 80.0–100.0)
Monocytes Absolute: 0.5 10*3/uL (ref 0.1–1.0)
Monocytes Relative: 3 %
Neutro Abs: 11.4 10*3/uL — ABNORMAL HIGH (ref 1.7–7.7)
Neutrophils Relative %: 84 %
Platelets: 411 10*3/uL — ABNORMAL HIGH (ref 150–400)
RBC: 4.71 MIL/uL (ref 3.87–5.11)
RDW: 13 % (ref 11.5–15.5)
WBC: 13.7 10*3/uL — ABNORMAL HIGH (ref 4.0–10.5)
nRBC: 0 % (ref 0.0–0.2)

## 2020-12-21 LAB — COMPREHENSIVE METABOLIC PANEL
ALT: 28 U/L (ref 0–44)
AST: 15 U/L (ref 15–41)
Albumin: 4.6 g/dL (ref 3.5–5.0)
Alkaline Phosphatase: 52 U/L (ref 38–126)
Anion gap: 12 (ref 5–15)
BUN: 15 mg/dL (ref 6–20)
CO2: 19 mmol/L — ABNORMAL LOW (ref 22–32)
Calcium: 9.9 mg/dL (ref 8.9–10.3)
Chloride: 106 mmol/L (ref 98–111)
Creatinine, Ser: 0.74 mg/dL (ref 0.44–1.00)
GFR, Estimated: >60 mL/min (ref >60)
Glucose, Bld: 133 mg/dL — ABNORMAL HIGH (ref 70–99)
Potassium: 4.4 mmol/L (ref 3.5–5.1)
Sodium: 137 mmol/L (ref 135–145)
Total Bilirubin: 0.4 mg/dL (ref 0.3–1.2)
Total Protein: 7.7 g/dL (ref 6.5–8.1)

## 2020-12-21 LAB — I-STAT BETA HCG BLOOD, ED (MC, WL, AP ONLY): I-stat hCG, quantitative: <5 m[IU]/mL (ref <5)

## 2020-12-21 MED ORDER — HYDROXYZINE HCL 25 MG PO TABS
25.0000 mg | ORAL_TABLET | Freq: Four times a day (QID) | ORAL | 0 refills | Status: DC
Start: 2020-12-21 — End: 2020-12-21

## 2020-12-21 MED ORDER — HYDROXYZINE HCL 25 MG PO TABS
25.0000 mg | ORAL_TABLET | Freq: Four times a day (QID) | ORAL | 0 refills | Status: AC
Start: 2020-12-21 — End: 2020-12-28

## 2020-12-21 MED ORDER — HYDROXYZINE HCL 25 MG PO TABS
25.0000 mg | ORAL_TABLET | Freq: Once | ORAL | Status: AC
  Administered 2020-12-21: 25 mg via ORAL
  Filled 2020-12-21: qty 1

## 2020-12-21 NOTE — ED Provider Notes (Signed)
[facility]-EMERGENCY DEPT Provider Note  CSN: [id number] Arrival date & time: 12/21/20 0250  Chief Complaint(s) Pruritis  HPI Donna Parks is a 35 y.o. female with no pertinent past medical history here for 5 days of general itching that began in her head and spread down to the rest of her body.  No associated rash.  No recent fevers or infections.  No new medicine or cosmetic products.  No known triggers.  No shortness of breath.  Patient has tried Benadryl without relief.  She saw her PCPs office and was prescribed prednisone which has not helped.  Denies any known liver disease.    HPI  Past Medical History Past Medical History:  Diagnosis Date   Asthma    Vaginal Pap smear, abnormal    Patient Active Problem List   Diagnosis Date Noted   Indication for care in labor or delivery 10/06/2015   Postpartum state 10/06/2015   Home Medication(s) Prior to Admission medications   Medication Sig Start Date End Date Taking? Authorizing Provider  predniSONE (DELTASONE) 20 MG tablet Take 20 mg by mouth 2 (two) times daily.   Yes [provider]  topiramate (TOPAMAX) 25 MG tablet Take 25 mg by mouth 2 (two) times daily as needed. 06/03/18  Yes [provider]  hydrOXYzine (ATARAX/VISTARIL) 25 MG tablet Take 1 tablet (25 mg total) by mouth every 6 (six) hours for 7 days. 12/21/20 12/28/20  Nira Conn, MD  cetirizine (ZYRTEC) 10 MG chewable tablet Chew 1 tablet (10 mg total) by mouth daily. 07/14/18 11/21/19  Wurst, [place], PA-C  fluticasone (FLONASE) 50 MCG/ACT nasal spray Place 2 sprays into both nostrils daily. 07/14/18 11/21/19  Rennis Harding, PA-C                                                                                                                                    Past Surgical History Past Surgical History:  Procedure Laterality Date   TONSILLECTOMY     Family History Family History  Problem Relation Age of Onset    Cancer Other    Diabetes Other    Hypertension Other    Diabetes Mother    Healthy Father     Social History Social History   Tobacco Use   Smoking status: Former    Packs/day: 0.50    Types: Cigarettes    Quit date: 05/26/2014    Years since quitting: 6.5   Smokeless tobacco: Never  Substance Use Topics   Alcohol use: No   Drug use: No   Allergies Penicillins  Review of Systems Review of Systems All other systems are reviewed and are negative for acute change except as noted in the HPI  Physical Exam Vital Signs  I have reviewed the triage vital signs BP (!) 158/105 (BP Location: Right Arm)   Pulse 71   Temp 98.7 F (37.1 C) (Oral)   Resp 14  SpO2 95%   Physical Exam Vitals reviewed.  Constitutional:      General: She is not in acute distress.    Appearance: She is well-developed. She is not diaphoretic.  HENT:     Head: Normocephalic and atraumatic.     Nose: Nose normal.  Eyes:     General: No scleral icterus.       Right eye: No discharge.        Left eye: No discharge.     Conjunctiva/sclera: Conjunctivae normal.     Pupils: Pupils are equal, round, and reactive to light.  Cardiovascular:     Rate and Rhythm: Normal rate and regular rhythm.     Heart sounds: No murmur heard.   No friction rub. No gallop.  Pulmonary:     Effort: Pulmonary effort is normal. No respiratory distress.     Breath sounds: Normal breath sounds. No stridor. No rales.  Abdominal:     General: There is no distension.     Palpations: Abdomen is soft.     Tenderness: There is no abdominal tenderness.  Musculoskeletal:        General: No tenderness.     Cervical back: Normal range of motion and neck supple.  Skin:    General: Skin is warm and dry.     Findings: No erythema, lesion, petechiae, rash or wound.  Neurological:     Mental Status: She is alert and oriented to person, place, and time.    ED Results and Treatments Labs (all labs ordered are listed, but only  abnormal results are displayed) Labs Reviewed  CBC WITH DIFFERENTIAL/PLATELET - Abnormal; Notable for the following components:      Result Value   WBC 13.7 (*)    Hemoglobin 15.4 (*)    Platelets 411 (*)    Neutro Abs 11.4 (*)    Abs Immature Granulocytes 0.08 (*)    All other components within normal limits  COMPREHENSIVE METABOLIC PANEL - Abnormal; Notable for the following components:   CO2 19 (*)    Glucose, Bld 133 (*)    All other components within normal limits  I-STAT BETA HCG BLOOD, ED ([facility], [facility], [facility] ONLY)                                                                                                                         EKG  EKG Interpretation  Date/Time:    Ventricular Rate:    PR Interval:    QRS Duration:   QT Interval:    QTC Calculation:   R Axis:     Text Interpretation:         Radiology No results found.  Pertinent labs & imaging results that were available during my care of the patient were reviewed by me and considered in my medical decision making (see chart for details).  Medications Ordered in ED Medications  hydrOXYzine (ATARAX/VISTARIL) tablet 25 mg (has no administration in time range)  Procedures Procedures  (including critical care time)  Medical Decision Making / ED Course I have reviewed the nursing notes for this encounter and the patient's prior records (if available in EHR or on provided paperwork).   Donna Parks was evaluated in Emergency Department on 12/21/2020 for the symptoms described in the history of present illness. She was evaluated in the context of the global COVID-19 pandemic, which necessitated consideration that the patient might be at risk for infection with the SARS-CoV-2 virus that causes COVID-19. Institutional protocols and algorithms that pertain to the evaluation of patients at  risk for COVID-19 are in a state of rapid change based on information released by regulatory bodies including the CDC and federal and state organizations. These policies and algorithms were followed during the patient's care in the ED.  Labs notable for leukocytosis but this is likely secondary to prednisone. No significant electrolyte derangements or renal insufficiency. No transaminitis or hyperbilirubinemia.  While updating the patient she got a message on her phone saying that her labs from her PCP were ready.  When compared, she did not have any leukocytosis -confirming the likelihood of prednisone as the cause. She had UA that was not consistent with a urinary tract infection. Rest of the labs were stable and reassuring.  Will attempt treatment with Vistaril.        Final Clinical Impression(s) / ED Diagnoses Final diagnoses:  Itching   The patient appears reasonably screened and/or stabilized for discharge and I doubt any other medical condition or other [facility] requiring further screening, evaluation, or treatment in the ED at this time prior to discharge. Safe for discharge with strict return precautions.  Disposition: Discharge  Condition: Good  I have discussed the results, Dx and Tx plan with the patient/family who expressed understanding and agree(s) with the plan. Discharge instructions discussed at length. The patient/family was given strict return precautions who verbalized understanding of the instructions. No further questions at time of discharge.    ED Discharge Orders          Ordered    hydrOXYzine (ATARAX/VISTARIL) 25 MG tablet  Every 6 hours        12/21/20 0558             Follow Up: Primary care provider  Call  in 1-2 weeks, if symptoms do not improve or  worsen      This chart was dictated using voice recognition software.  Despite best efforts to proofread,  errors can occur which can change the documentation meaning.    Nira Conn, MD 12/21/20 [phone]

## 2020-12-21 NOTE — ED Triage Notes (Signed)
Patient is A&Ox4 and here for generalized itching all over. She went to the doctors at 10am they prescribed prednisone. Patient has had no relief and the itching seem to be getting worse.

## 2020-12-30 IMAGING — MG DIGITAL DIAGNOSTIC BILAT W/ TOMO W/ CAD
6 of 10 series · 6 of 30 positions shown · non-contrast
Comparison: None.

CLINICAL DATA: 34-year-old female presenting for baseline mammogram
to evaluate 2 focal areas of pain. One is constant and in the
upper-outer posterior left breast, and the other is an intermittent
shooting pain in the lateral left breast extending to the nipple.
The patient reports no family history of breast cancer.

EXAM:
DIGITAL DIAGNOSTIC BILATERAL MAMMOGRAM WITH TOMO AND CAD; ULTRASOUND
LEFT BREAST LIMITED

[R MLO synth-2D]
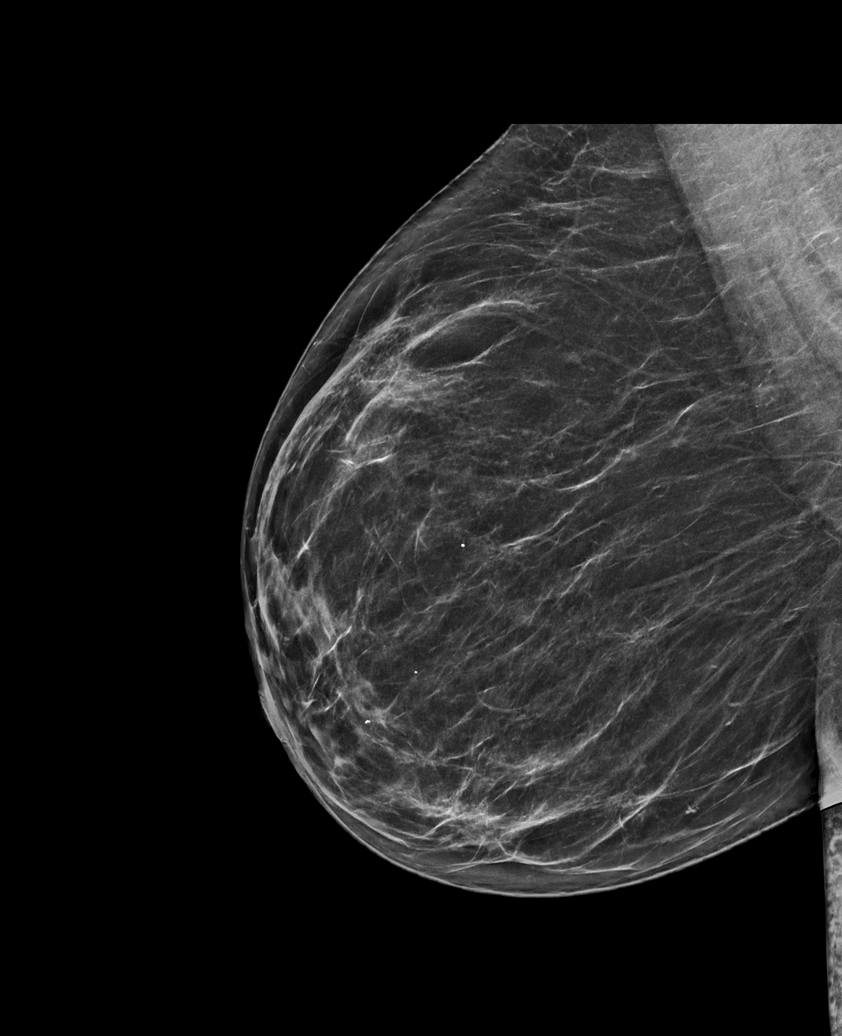

[L MLO synth-2D]
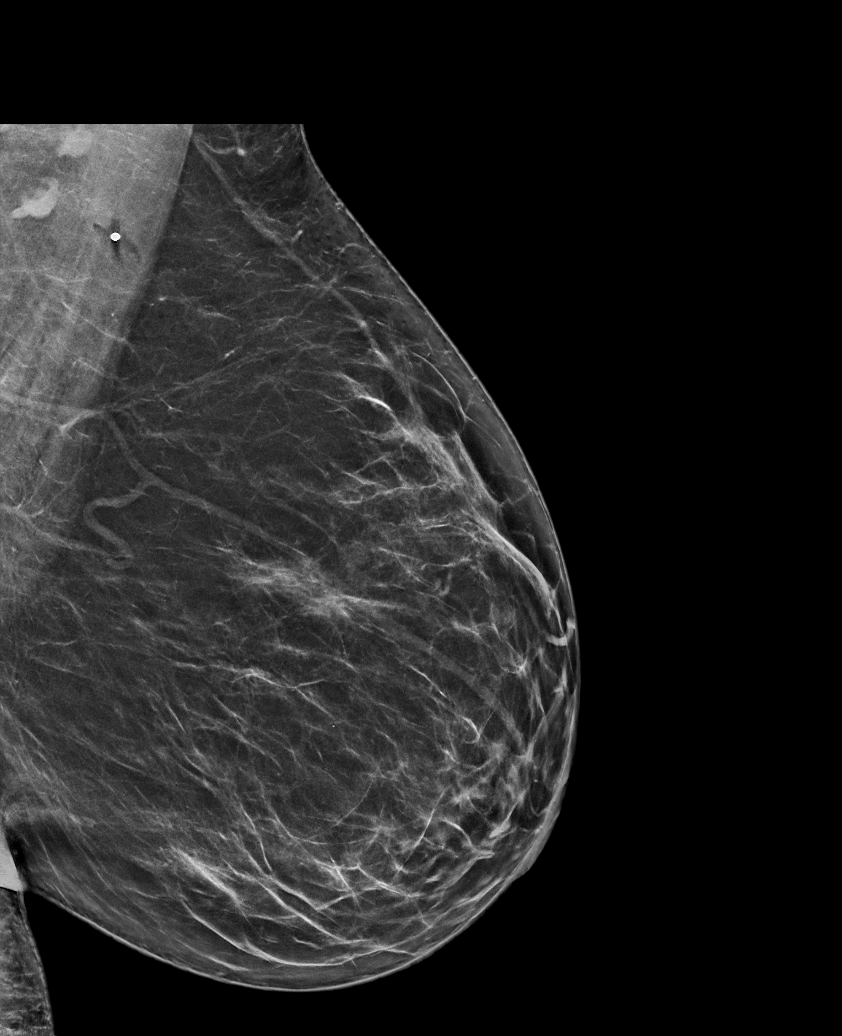

[R CC synth-2D]
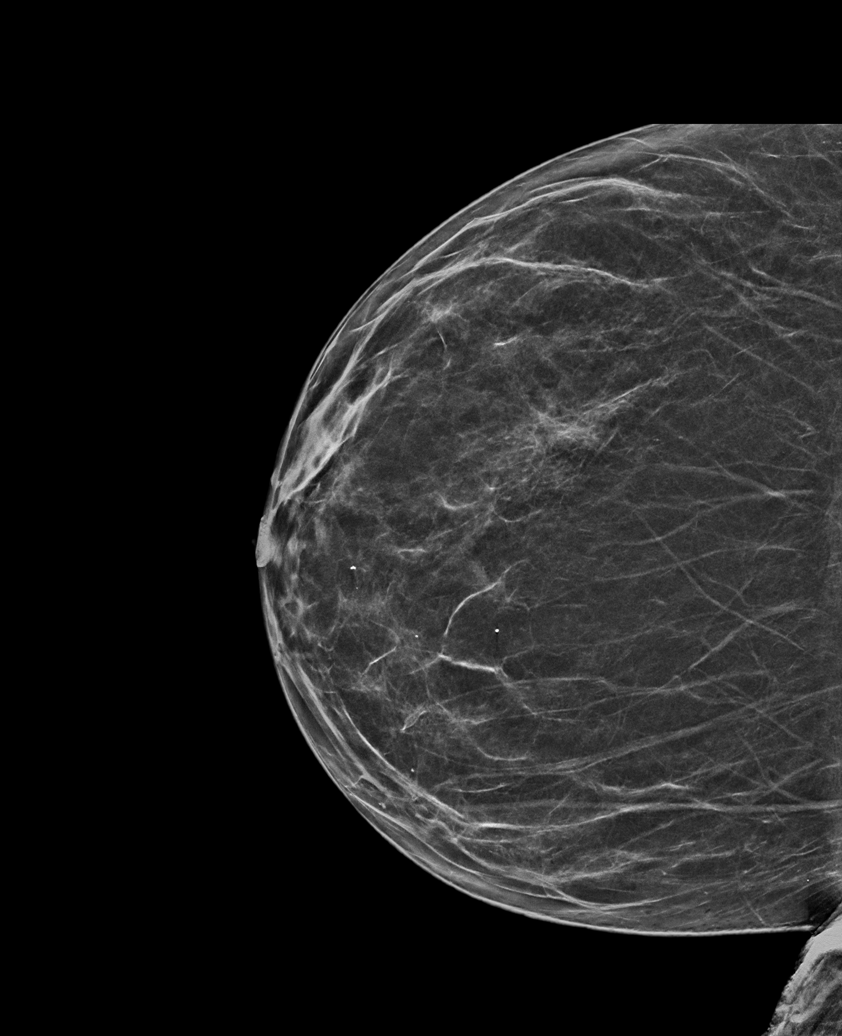

[L TAN synth-2D]
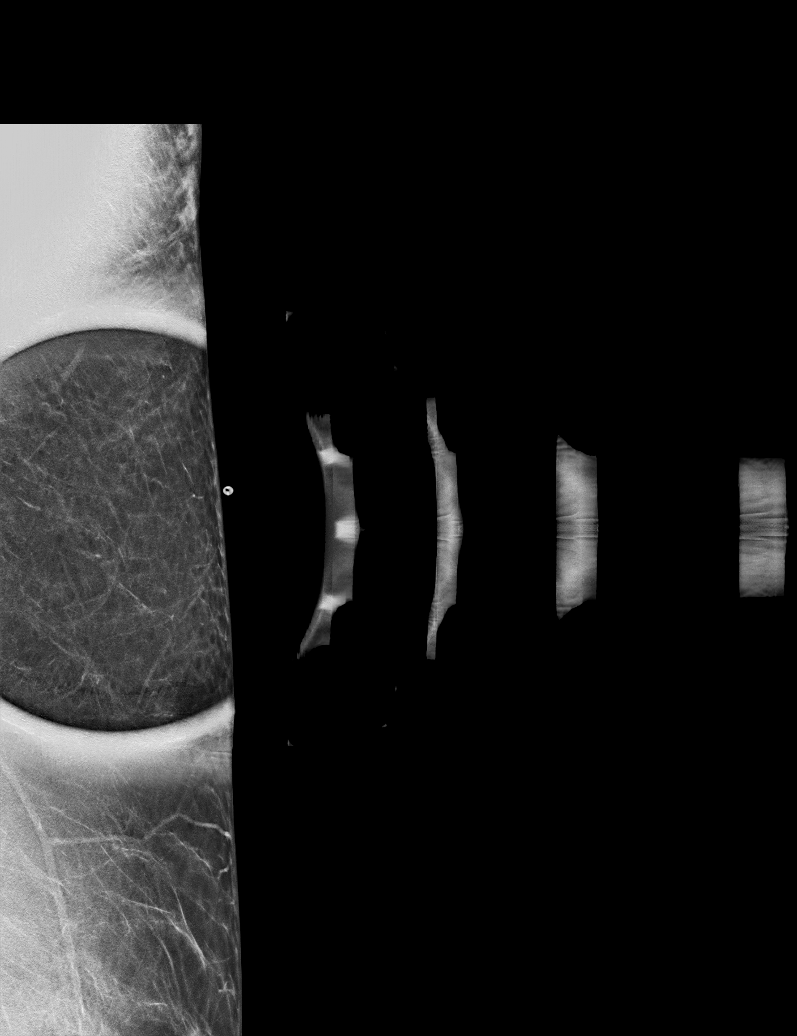

[L CC synth-2D]
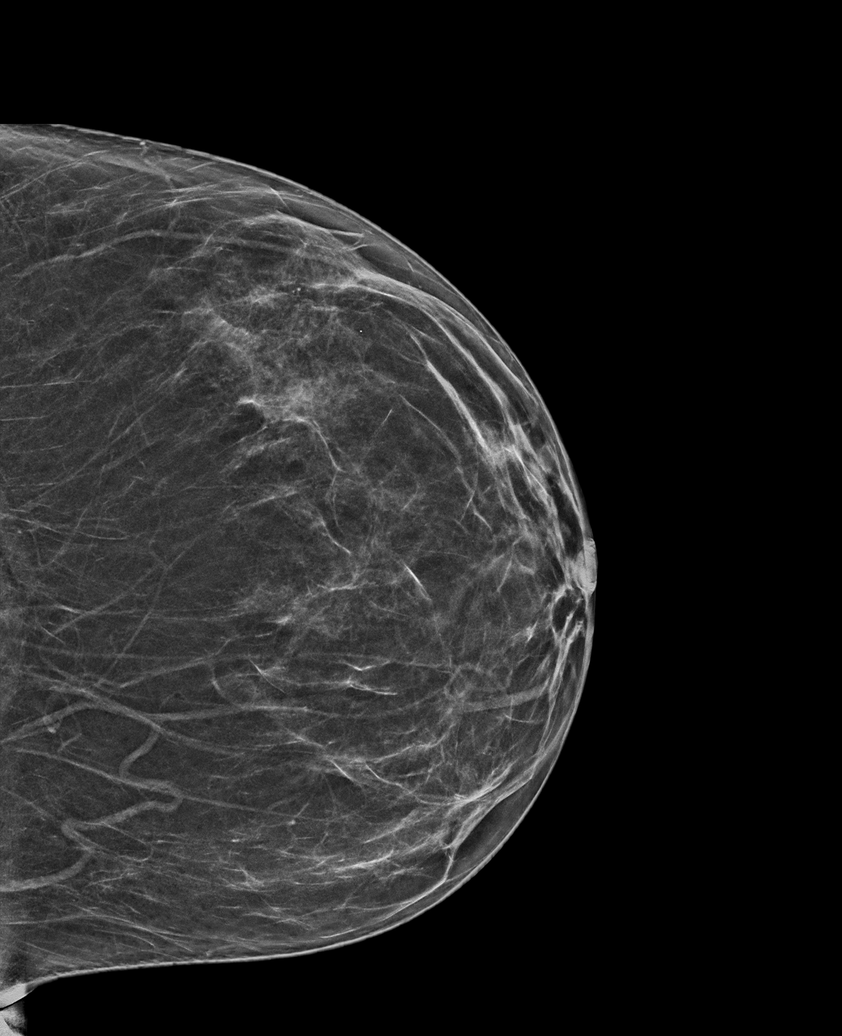

[L CC tomo · tomo slice 32/63.0]
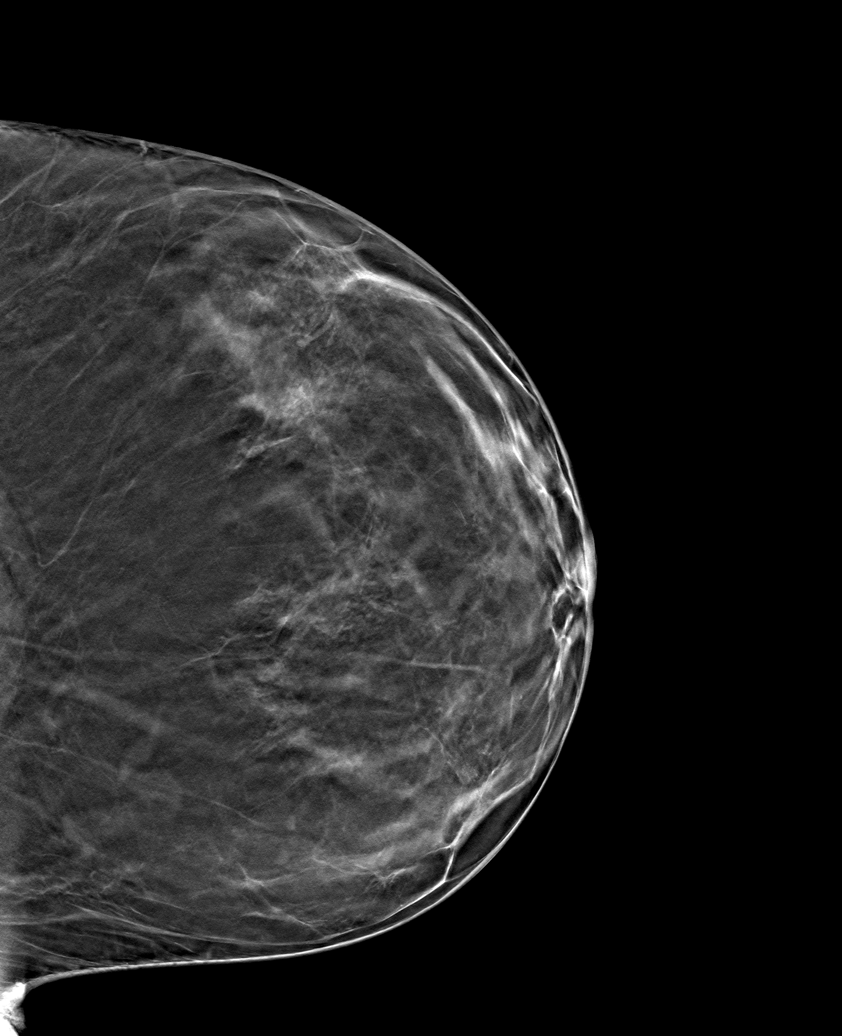

[6 of 30 positions shown; findings below may reference images not displayed]

ACR Breast Density Category b: There are scattered areas of
fibroglandular density.
FINDINGS: No suspicious calcifications, masses or areas of distortion are seen
in the bilateral breasts. A BB has been placed at the constant site
of focal pain in the upper-outer left breast. No suspicious findings
are seen deep to this marker.

Mammographic images were processed with CAD.

Ultrasound targeted to the 2 sites of pain in the lateral to
upper-outer quadrant of the left breast demonstrates normal
fibroglandular tissue. No suspicious masses or areas of shadowing
are identified.
IMPRESSION: 1. There are no suspicious mammographic or targeted sonographic
abnormalities at the sites of pain in the upper-outer to lateral
left breast.

2.  No mammographic evidence of malignancy in the bilateral breasts.

RECOMMENDATION:
1. Clinical follow-up recommended for the pain in the left breast.
Any further workup should be based on clinical grounds.

2. Screening mammogram at age 40 unless there are persistent or
intervening clinical concerns. (Code:9G-X-PEH)

I have discussed the findings and recommendations with the patient.
If applicable, a reminder letter will be sent to the patient
regarding the next appointment.

BI-RADS CATEGORY  1: Negative.

## 2021-01-09 ENCOUNTER — Other Ambulatory Visit: Payer: Self-pay

## 2021-01-09 ENCOUNTER — Ambulatory Visit (HOSPITAL_COMMUNITY)
Admission: EM | Admit: 2021-01-09 | Discharge: 2021-01-09 | Disposition: A | Discharge status: Home / Self Care | Payer: 59 | Attending: Physician Assistant | Admitting: Physician Assistant

## 2021-01-09 ENCOUNTER — Encounter (HOSPITAL_COMMUNITY): Payer: Self-pay

## 2021-01-09 DIAGNOSIS — K644 Residual hemorrhoidal skin tags: Secondary | ICD-10-CM | POA: Diagnosis not present

## 2021-01-09 MED ORDER — HYDROCORTISONE ACETATE 25 MG RE SUPP
25.0000 mg | Freq: Two times a day (BID) | RECTAL | 0 refills | Status: DC
Start: 2021-01-09 — End: 2023-07-21

## 2021-01-09 MED ORDER — HYDROCODONE-ACETAMINOPHEN 5-325 MG PO TABS: 1.0000 | ORAL_TABLET | Freq: Two times a day (BID) | ORAL | 0 refills | Status: DC | PRN

## 2021-01-09 MED ORDER — HYDROCORTISONE (PERIANAL) 2.5 % EX CREA
1.0000 | TOPICAL_CREAM | Freq: Two times a day (BID) | CUTANEOUS | 0 refills | Status: DC
Start: 2021-01-09 — End: 2023-07-21

## 2021-01-09 NOTE — Discharge Instructions (Addendum)
Please start using hydrocortisone rectal cream twice a day and suppositories up to twice a day to help manage your symptoms symptoms.  I have called in 6 tablets of hydrocodone to help with pain.  This can be taken up to twice a day but he should not drive or drink alcohol while taking it is all make you very sleepy.  I think it is very important that you follow-up with GI specialist as we discussed so please call them to schedule an appointment immediately after leaving here.  If you have any worsening pain, rectal bleeding, difficulty passing stool, nausea/vomiting you need to go to the emergency room as we discussed.

## 2021-01-09 NOTE — ED Provider Notes (Signed)
[facility]    CSN: [id number] Arrival date & time: 01/09/21  0805      History   Chief Complaint Chief Complaint  Patient presents with   Hemorrhoids    HPI Donna Parks is a 35 y.o. female.   Patient presents today with a 3-day history of irritated external hemorrhoid.  She does have a history of hemorrhoids but states these typically resolve with over-the-counter medications including Preparation H.  She has tried numerous of these medications without improvement of symptoms.  She denies any recent changes in bowel habits including diarrhea or constipation.  She reports pain is severe and rated 10 on a 0-10 pain scale, localized to affected area without radiation, worse with prolonged sitting, no alleviating factors identified.  She has difficulty sitting for prolonged period of time as result of symptoms and has to sit on a pillow to provide some relief.  Reports symptoms are severe enough that she is having difficulty sleeping at night due to pain.  She has not seen a GI specialist or colorectal surgeon in the past.  No additional complaints or concerns today.   Past Medical History:  Diagnosis Date   Asthma    Vaginal Pap smear, abnormal     Patient Active Problem List   Diagnosis Date Noted   Indication for care in labor or delivery 10/06/2015   Postpartum state 10/06/2015    Past Surgical History:  Procedure Laterality Date   TONSILLECTOMY      OB History     Gravida  3   Para  3   Term  3   Preterm      AB      Living  1      SAB      IAB      Ectopic      Multiple  0   Live Births  1            Home Medications    Prior to Admission medications   Medication Sig Start Date End Date Taking? Authorizing Provider  HYDROcodone-acetaminophen (NORCO/VICODIN) 5-325 MG tablet Take 1 tablet by mouth every 12 (twelve) hours as needed. 01/09/21  Yes Rosie Golson, Noberto Retort, PA-C  hydrocortisone (ANUSOL-HC) 2.5 % rectal cream Place 1  application rectally 2 (two) times daily. 01/09/21  Yes Nylen Creque, Noberto Retort, PA-C  hydrocortisone (ANUSOL-HC) 25 MG suppository Place 1 suppository (25 mg total) rectally 2 (two) times daily. 01/09/21  Yes Maybelle Depaoli K, PA-C  predniSONE (DELTASONE) 20 MG tablet Take 20 mg by mouth 2 (two) times daily.    [provider]  topiramate (TOPAMAX) 25 MG tablet Take 25 mg by mouth 2 (two) times daily as needed. 06/03/18   [provider]  cetirizine (ZYRTEC) 10 MG chewable tablet Chew 1 tablet (10 mg total) by mouth daily. 07/14/18 11/21/19  Wurst, [place], PA-C  fluticasone (FLONASE) 50 MCG/ACT nasal spray Place 2 sprays into both nostrils daily. 07/14/18 11/21/19  Rennis Harding, PA-C    Family History Family History  Problem Relation Age of Onset   Cancer Other    Diabetes Other    Hypertension Other    Diabetes Mother    Healthy Father     Social History Social History   Tobacco Use   Smoking status: Former    Packs/day: 0.50    Types: Cigarettes    Quit date: 05/26/2014    Years since quitting: 6.6   Smokeless tobacco: Never  Substance Use Topics  Alcohol use: No   Drug use: No     Allergies   Penicillins   Review of Systems Review of Systems  Constitutional:  Positive for activity change. Negative for appetite change, fatigue and fever.  Respiratory:  Negative for cough and shortness of breath.   Cardiovascular:  Negative for chest pain.  Gastrointestinal:  Positive for rectal pain. Negative for abdominal pain, anal bleeding, blood in stool, constipation, diarrhea, nausea and vomiting.  Genitourinary:  Negative for dysuria, frequency, pelvic pain and urgency.  Neurological:  Negative for dizziness, light-headedness and headaches.    Physical Exam Triage Vital Signs ED Triage Vitals  Enc Vitals Group     BP 01/09/21 0821 (!) 151/99     Pulse Rate 01/09/21 0821 73     Resp 01/09/21 0821 18     Temp 01/09/21 0821 98 F (36.7 C)     Temp Source 01/09/21  0821 Oral     SpO2 01/09/21 0821 100 %     Weight --      Height --      Head Circumference --      Peak Flow --      Pain Score 01/09/21 0822 10     Pain Loc --      Pain Edu? --      Excl. in GC? --    No data found.  Updated Vital Signs BP (!) 151/99 (BP Location: Left Arm)   Pulse 73   Temp 98 F (36.7 C) (Oral)   Resp 18   SpO2 100%   Visual Acuity Right Eye Distance:   Left Eye Distance:   Bilateral Distance:    Right Eye Near:   Left Eye Near:    Bilateral Near:     Physical Exam Vitals reviewed.  Constitutional:      General: She is awake. She is not in acute distress.    Appearance: Normal appearance. She is normal weight. She is not ill-appearing.     Comments: Very pleasant female appears stated age in no acute distress sitting comfortably in exam room  HENT:     Head: Normocephalic and atraumatic.  Cardiovascular:     Rate and Rhythm: Normal rate and regular rhythm.     Heart sounds: Normal heart sounds, S1 normal and S2 normal. No murmur heard. Pulmonary:     Effort: Pulmonary effort is normal.     Breath sounds: Normal breath sounds. No wheezing, rhonchi or rales.     Comments: Clear to auscultation bilaterally Abdominal:     General: Bowel sounds are normal.     Palpations: Abdomen is soft.     Tenderness: There is no abdominal tenderness. There is no right CVA tenderness, left CVA tenderness, guarding or rebound.     Comments: Benign abdominal exam  Genitourinary:    Rectum: External hemorrhoid present.     Comments: 2 cm external hemorrhoid noted at 5 o'clock position without evidence of thrombosis. Psychiatric:        Behavior: Behavior is cooperative.     UC Treatments / Results  Labs (all labs ordered are listed, but only abnormal results are displayed) Labs Reviewed - No data to display  EKG   Radiology No results found.  Procedures Procedures (including critical care time)  Medications Ordered in UC Medications - No data  to display  Initial Impression / Assessment and Plan / UC Course  I have reviewed the triage vital signs and the nursing notes.  Pertinent labs &  imaging results that were available during my care of the patient were reviewed by me and considered in my medical decision making (see chart for details).      No evidence of thrombosed hemorrhoid on exam.  Patient was started on hydrocortisone cream and hydrocortisone suppositories to help manage symptoms.  She was given 6 tablets of hydrocodone given severity of pain with instruction not to drive or drink alcohol while taking these medications as drowsiness is a common side effect.  Review of [place] controlled substance database shows no inappropriate refills.  Discussed the importance of dietary lifestyle changes to help improve symptoms including avoiding constipation.  She was encouraged to use sitz bath for additional symptom relief.  Recommended she follow-up with her GI specialist and was given contact information for local provider group.  Discussed alarm symptoms that warrant emergent evaluation to which patient expressed understanding.  Strict return precautions given to which patient expressed understanding.  Final Clinical Impressions(s) / UC Diagnoses   Final diagnoses:  Inflamed external hemorrhoid     Discharge Instructions      Please start using hydrocortisone rectal cream twice a day and suppositories up to twice a day to help manage your symptoms symptoms.  I have called in 6 tablets of hydrocodone to help with pain.  This can be taken up to twice a day but he should not drive or drink alcohol while taking it is all make you very sleepy.  I think it is very important that you follow-up with GI specialist as we discussed so please call them to schedule an appointment immediately after leaving here.  If you have any worsening pain, rectal bleeding, difficulty passing stool, nausea/vomiting you need to go to the emergency  room as we discussed.     ED Prescriptions     Medication Sig Dispense Auth. Provider   hydrocortisone (ANUSOL-HC) 2.5 % rectal cream Place 1 application rectally 2 (two) times daily. 30 g Johaan Ryser K, PA-C   hydrocortisone (ANUSOL-HC) 25 MG suppository Place 1 suppository (25 mg total) rectally 2 (two) times daily. 12 suppository Yohann Curl K, PA-C   HYDROcodone-acetaminophen (NORCO/VICODIN) 5-325 MG tablet Take 1 tablet by mouth every 12 (twelve) hours as needed. 5 tablet Karelyn Brisby K, PA-C      I have reviewed the PDMP during this encounter.   Jeani Hawking, PA-C 01/09/21 0900

## 2021-01-09 NOTE — ED Triage Notes (Signed)
Pt presents with swollen and irritated hemorrhoids X 2 days.

## 2021-01-12 ENCOUNTER — Other Ambulatory Visit: Payer: Self-pay

## 2021-01-12 ENCOUNTER — Emergency Department (HOSPITAL_COMMUNITY)
Admission: EM | Admit: 2021-01-12 | Discharge: 2021-01-12 | Disposition: A | Discharge status: Home / Self Care | Payer: 59 | Attending: Emergency Medicine | Admitting: Emergency Medicine

## 2021-01-12 DIAGNOSIS — J45909 Unspecified asthma, uncomplicated: Secondary | ICD-10-CM | POA: Diagnosis not present

## 2021-01-12 DIAGNOSIS — Z79899 Other long term (current) drug therapy: Secondary | ICD-10-CM | POA: Insufficient documentation

## 2021-01-12 DIAGNOSIS — K644 Residual hemorrhoidal skin tags: Secondary | ICD-10-CM | POA: Diagnosis present

## 2021-01-12 DIAGNOSIS — Z87891 Personal history of nicotine dependence: Secondary | ICD-10-CM | POA: Insufficient documentation

## 2021-01-12 NOTE — Discharge Instructions (Addendum)
You came to the emergency department today to be evaluated for your hemorrhoid.  Based on your physical exam there is no acute treatment needed at this time.  Please continue to use hydrocortisone cream and Norco as prescribed.  Please perform sitz bath's at least 3 times daily.  Please contact general surgery to schedule to discuss hemorrhoid removal.  Get help right away if you have: Uncontrolled bleeding from your rectum.

## 2021-01-12 NOTE — ED Provider Notes (Signed)
[facility]-EMERGENCY DEPT Provider Note   CSN: [id number] Arrival date & time: 01/12/21  1122     History Chief Complaint  Patient presents with   Hemorrhoids    Donna Parks is a 35 y.o. female with a history of asthma.  Patient presents to the emergency department with a chief complaint of hemorrhoid.  Patient reports that hemorrhoid developed Monday night.  Patient reports that she has had continued pain since then.  Pain has waxed and waned in intensity however has improved over the last few days.  At present patient rates pain 7/10 on pain scale.  Pain is worse with touch.  Patient had some relief with topical cream and hydrocodone.  Patient reports she noticed minimal bright red bleeding from hemorrhoid today.  Patient denies any constipation, inability to have bowel movements, melena, abdominal pain, nausea, vomiting, fevers, chills.  Patient reports that she has been seen by her primary care doctor and urgent care for the same issue.  HPI     Past Medical History:  Diagnosis Date   Asthma    Vaginal Pap smear, abnormal     Patient Active Problem List   Diagnosis Date Noted   Indication for care in labor or delivery 10/06/2015   Postpartum state 10/06/2015    Past Surgical History:  Procedure Laterality Date   TONSILLECTOMY       OB History     Gravida  3   Para  3   Term  3   Preterm      AB      Living  1      SAB      IAB      Ectopic      Multiple  0   Live Births  1           Family History  Problem Relation Age of Onset   Cancer Other    Diabetes Other    Hypertension Other    Diabetes Mother    Healthy Father     Social History   Tobacco Use   Smoking status: Former    Packs/day: 0.50    Types: Cigarettes    Quit date: 05/26/2014    Years since quitting: 6.6   Smokeless tobacco: Never  Substance Use Topics   Alcohol use: No   Drug use: No    Home Medications Prior to Admission  medications   Medication Sig Start Date End Date Taking? Authorizing Provider  HYDROcodone-acetaminophen (NORCO/VICODIN) 5-325 MG tablet Take 1 tablet by mouth every 12 (twelve) hours as needed. 01/09/21   Raspet, Noberto Retort, PA-C  hydrocortisone (ANUSOL-HC) 2.5 % rectal cream Place 1 application rectally 2 (two) times daily. 01/09/21   Raspet, Noberto Retort, PA-C  hydrocortisone (ANUSOL-HC) 25 MG suppository Place 1 suppository (25 mg total) rectally 2 (two) times daily. 01/09/21   Raspet, Noberto Retort, PA-C  predniSONE (DELTASONE) 20 MG tablet Take 20 mg by mouth 2 (two) times daily.    [provider]  topiramate (TOPAMAX) 25 MG tablet Take 25 mg by mouth 2 (two) times daily as needed. 06/03/18   [provider]  cetirizine (ZYRTEC) 10 MG chewable tablet Chew 1 tablet (10 mg total) by mouth daily. 07/14/18 11/21/19  Wurst, [place], PA-C  fluticasone (FLONASE) 50 MCG/ACT nasal spray Place 2 sprays into both nostrils daily. 07/14/18 11/21/19  Rennis Harding, PA-C    Allergies    Penicillins  Review of Systems   Review of  Systems  Constitutional:  Negative for chills and fever.  Gastrointestinal:  Negative for abdominal pain, nausea and vomiting.  Skin:  Negative for color change, pallor, rash and wound.   Physical Exam Updated Vital Signs BP 132/80   Pulse 68   Temp 98.7 F (37.1 C) (Oral)   Resp 16   SpO2 94%   Physical Exam Vitals and nursing note reviewed. Exam conducted with a chaperone present (Female RN present as chaperone).  Constitutional:      General: She is not in acute distress.    Appearance: She is not ill-appearing, toxic-appearing or diaphoretic.  HENT:     Head: Normocephalic.  Eyes:     General: No scleral icterus.       Right eye: No discharge.        Left eye: No discharge.  Cardiovascular:     Rate and Rhythm: Normal rate.  Pulmonary:     Effort: Pulmonary effort is normal.  Genitourinary:    Rectum: Tenderness and external hemorrhoid present. No mass,  anal fissure or internal hemorrhoid. Normal anal tone.     Comments: 2 cm hemorrhoid noted at 3 o'clock position, no signs of thrombosis, tenderness to palpation.  No bleeding noted. Skin:    General: Skin is warm and dry.  Neurological:     General: No focal deficit present.     Mental Status: She is alert.  Psychiatric:        Behavior: Behavior is cooperative.    ED Results / Procedures / Treatments   Labs (all labs ordered are listed, but only abnormal results are displayed) Labs Reviewed - No data to display  EKG None  Radiology No results found.  Procedures Procedures   Medications Ordered in ED Medications - No data to display  ED Course  I have reviewed the triage vital signs and the nursing notes.  Pertinent labs & imaging results that were available during my care of the patient were reviewed by me and considered in my medical decision making (see chart for details).    MDM Rules/Calculators/A&P                           Alert 35 year old female no acute stress, nontoxic-appearing.  Patient presents with chief complaint of external hemorrhoid.  Presents to today due to continued pain, and new onset of bleeding.  On physical exam patient has 2 cm hemorrhoid noted at 3 o'clock position, no signs of thrombosis.  Tenderness to palpation.  No signs of bleeding.  Patient has prescription for Anusol and Norco.  Patient advised to continue using these modalities as needed.  Encourage patient to perform frequent sits baths.  We will give patient information to follow-up with general surgery.  Discussed results, findings, treatment and follow up. Patient advised of return precautions. Patient verbalized understanding and agreed with plan.    Final Clinical Impression(s) / ED Diagnoses Final diagnoses:  External hemorrhoids    Rx / DC Orders ED Discharge Orders     None        Berneice Heinrich 01/12/21 1739    Bethann Berkshire, MD 01/14/21  1521

## 2021-01-12 NOTE — ED Triage Notes (Signed)
Pt arrived via POV, c/o painful hemorrhoids x7 days. Seen by PCP and urgent care, given topical cream and told to come to ED if no improvement.

## 2021-04-08 NOTE — Progress Notes (Deleted)
NEW PATIENT Date of Service/Encounter:  04/08/21 Referring provider: Norm Salt, PA Primary care provider: System, Provider Not In  Subjective:  IDALI LAFEVER is a 35 y.o. female with a PMHx of *** presenting today for evaluation of persistent pruritus.  History obtained from: chart review and {Persons; PED relatives w/patient:19415::"patient"}.   Itching without a rash-ED visit on 12/21/20-no rash-Benadryl not helpful; CBCd with mild leukocyctosis and left shift-thought secondary to prednisone, as reportedly CBC from previous week (from PCP office) did not show this; CMP within normal limits; tx-atarax Other allergy screening: Asthma: {Blank single:19197::"yes","no"} Rhino conjunctivitis: {Blank single:19197::"yes","no"} Food allergy: {Blank single:19197::"yes","no"} Medication allergy: {Blank single:19197::"yes","no"} Hymenoptera allergy: {Blank single:19197::"yes","no"} Urticaria: {Blank single:19197::"yes","no"} Eczema:{Blank single:19197::"yes","no"} History of recurrent infections suggestive of immunodeficency: {Blank single:19197::"yes","no"} ***Vaccinations are up to date.   Past Medical History: Past Medical History:  Diagnosis Date   Asthma    Vaginal Pap smear, abnormal    Medication List:  Current Outpatient Medications  Medication Sig Dispense Refill   HYDROcodone-acetaminophen (NORCO/VICODIN) 5-325 MG tablet Take 1 tablet by mouth every 12 (twelve) hours as needed. 5 tablet 0   hydrocortisone (ANUSOL-HC) 2.5 % rectal cream Place 1 application rectally 2 (two) times daily. 30 g 0   hydrocortisone (ANUSOL-HC) 25 MG suppository Place 1 suppository (25 mg total) rectally 2 (two) times daily. 12 suppository 0   predniSONE (DELTASONE) 20 MG tablet Take 20 mg by mouth 2 (two) times daily.     topiramate (TOPAMAX) 25 MG tablet Take 25 mg by mouth 2 (two) times daily as needed.     No current facility-administered medications for this visit.   Known Allergies:   Allergies  Allergen Reactions   Penicillins Hives and Other (See Comments)    Has patient had a PCN reaction causing immediate rash, facial/tongue/throat swelling, SOB or lightheadedness with hypotension: No Has patient had a PCN reaction causing severe rash involving mucus membranes or skin necrosis: No Has patient had a PCN reaction that required hospitalization No Has patient had a PCN reaction occurring within the last 10 years: No If all of the above answers are "NO", then may proceed with Cephalosporin use.   Past Surgical History: Past Surgical History:  Procedure Laterality Date   TONSILLECTOMY     Family History: Family History  Problem Relation Age of Onset   Cancer Other    Diabetes Other    Hypertension Other    Diabetes Mother    Healthy Father    Social History: [place] lives ***.   ROS:  All other systems negative except as noted per HPI.  Objective:  There were no vitals taken for this visit. There is no height or weight on file to calculate BMI. Physical Exam: Physical Exam  Diagnostics: Spirometry:  Tracings reviewed. Her effort: {Blank single:19197::"Good reproducible efforts.","It was hard to get consistent efforts and there is a question as to whether this reflects a maximal maneuver.","Poor effort, data can not be interpreted."} FVC: ***L FEV1: ***L, ***% predicted FEV1/FVC ratio: ***% Interpretation: {Blank single:19197::"Spirometry consistent with mild obstructive disease","Spirometry consistent with moderate obstructive disease","Spirometry consistent with severe obstructive disease","Spirometry consistent with possible restrictive disease","Spirometry consistent with mixed obstructive and restrictive disease","Spirometry uninterpretable due to technique","Spirometry consistent with normal pattern","No overt abnormalities noted given today's efforts"}.  Please see scanned spirometry results for details.  Skin Testing: {Blank single:19197::"Select  foods","Environmental allergy panel","Environmental allergy panel and select foods","Food allergy panel","None","Deferred due to recent antihistamines use"}. Positive test to: ***. Negative test to: ***.  Results discussed  with patient/family.   {Blank single:19197::"Allergy testing results were read and interpreted by myself, documented by clinical staff."," "}  Assessment and Plan  There are no Patient Instructions on file for this visit.  No follow-ups on file.  {Blank single:19197::"This note in its entirety was forwarded to the Provider who requested this consultation."}  Thank you for your kind referral. I appreciate the opportunity to take part in [place] care. Please do not hesitate to contact me with questions.***  Sincerely,  Tonny Bollman, MD [facility]

## 2021-04-10 ENCOUNTER — Ambulatory Visit: Payer: Self-pay | Admitting: Internal Medicine

## 2021-04-24 ENCOUNTER — Encounter: Payer: Self-pay | Admitting: Internal Medicine

## 2023-04-14 ENCOUNTER — Encounter (HOSPITAL_COMMUNITY): Payer: Self-pay

## 2023-04-14 ENCOUNTER — Emergency Department (HOSPITAL_COMMUNITY)
Admission: EM | Admit: 2023-04-14 | Discharge: 2023-04-14 | Disposition: A | Discharge status: Home / Self Care | Payer: 59 | Attending: Emergency Medicine | Admitting: Emergency Medicine

## 2023-04-14 ENCOUNTER — Other Ambulatory Visit: Payer: Self-pay

## 2023-04-14 DIAGNOSIS — S50861A Insect bite (nonvenomous) of right forearm, initial encounter: Secondary | ICD-10-CM | POA: Insufficient documentation

## 2023-04-14 DIAGNOSIS — W57XXXA Bitten or stung by nonvenomous insect and other nonvenomous arthropods, initial encounter: Secondary | ICD-10-CM | POA: Diagnosis not present

## 2023-04-14 DIAGNOSIS — L03113 Cellulitis of right upper limb: Secondary | ICD-10-CM | POA: Insufficient documentation

## 2023-04-14 DIAGNOSIS — M79631 Pain in right forearm: Secondary | ICD-10-CM | POA: Diagnosis present

## 2023-04-14 MED ORDER — KETOROLAC TROMETHAMINE 30 MG/ML IJ SOLN
15.0000 mg | Freq: Once | INTRAMUSCULAR | Status: AC
  Administered 2023-04-14: 15 mg via INTRAMUSCULAR
  Filled 2023-04-14: qty 1

## 2023-04-14 MED ORDER — DOXYCYCLINE MONOHYDRATE 100 MG PO CAPS: 100.0000 mg | ORAL_CAPSULE | Freq: Two times a day (BID) | ORAL | 0 refills | Status: AC

## 2023-04-14 NOTE — Discharge Instructions (Addendum)
Thank you for letting us evaluate you today.  It appears that you had an infected insect bite.  I have sent doxycycline to your [facility] pharmacy on [address] Please take 1 capsule twice a day for 7 days.  Do not miss a dose.  This may cause mild GI upset and this is normal.  You may take a over-the-counter probiotic to counteract this. You may also take ibuprofen every 6-8hrs for pain and antiinflammatory properties starting in 6-8hrs as you received a strong ibuprofen here.  Please follow up with your PCP, urgent care for reassessment of arm following doxycycline/antibiotic course. Return to ED if you experience worsening swelling, inability to fully range elbow or wrist, shortness of breath, or sensation of throat closing/tingling.

## 2023-04-14 NOTE — ED Provider Notes (Signed)
[place] EMERGENCY DEPARTMENT AT [facility] Provider Note   CSN: [id number] Arrival date & time: 04/14/23  7829     History  Chief Complaint  Patient presents with   Insect Bite    Donna Parks is a 37 y.o. female with no significant past medical history presents emergency department for evaluation of right forearm pain, swelling, warmth, with mild drainage that she noticed this morning upon waking up.  Yesterday, she reports that she noticed a "insect bite" in same area and she was itching it throughout the day.  She denies fevers, difficulty ranging elbow or wrist.  HPI     Home Medications Prior to Admission medications   Medication Sig Start Date End Date Taking? Authorizing Provider  doxycycline (MONODOX) 100 MG capsule Take 1 capsule (100 mg total) by mouth 2 (two) times daily for 7 days. 04/14/23 04/21/23 Yes Judithann Sheen, PA  topiramate (TOPAMAX) 25 MG tablet Take 25 mg by mouth 2 (two) times daily as needed. 06/03/18  Yes [provider]  HYDROcodone-acetaminophen (NORCO/VICODIN) 5-325 MG tablet Take 1 tablet by mouth every 12 (twelve) hours as needed. 01/09/21   Raspet, Noberto Retort, PA-C  hydrocortisone (ANUSOL-HC) 2.5 % rectal cream Place 1 application rectally 2 (two) times daily. 01/09/21   Raspet, Noberto Retort, PA-C  hydrocortisone (ANUSOL-HC) 25 MG suppository Place 1 suppository (25 mg total) rectally 2 (two) times daily. 01/09/21   Raspet, Noberto Retort, PA-C  predniSONE (DELTASONE) 20 MG tablet Take 20 mg by mouth 2 (two) times daily.    [provider]  cetirizine (ZYRTEC) 10 MG chewable tablet Chew 1 tablet (10 mg total) by mouth daily. 07/14/18 11/21/19  Wurst, [place], PA-C  fluticasone (FLONASE) 50 MCG/ACT nasal spray Place 2 sprays into both nostrils daily. 07/14/18 11/21/19  Rennis Harding, PA-C      Allergies    Penicillins    Review of Systems   Review of Systems  Constitutional:  Negative for chills, fatigue and fever.  Respiratory:   Negative for cough, chest tightness, shortness of breath and wheezing.   Cardiovascular:  Negative for chest pain and palpitations.  Gastrointestinal:  Negative for abdominal pain, constipation, diarrhea, nausea and vomiting.  Skin:  Positive for color change and wound.  Neurological:  Negative for dizziness, seizures, weakness, light-headedness, numbness and headaches.    Physical Exam Updated Vital Signs BP (!) 161/104   Pulse 87   Temp 98.4 F (36.9 C) (Oral)   Resp 16   Ht 5\' 5"  (1.651 m)   Wt 91.2 kg   SpO2 96%   BMI 33.45 kg/m  Physical Exam Vitals and nursing note reviewed.  Constitutional:      General: She is not in acute distress.    Appearance: Normal appearance.  HENT:     Head: Normocephalic and atraumatic.  Eyes:     Conjunctiva/sclera: Conjunctivae normal.  Cardiovascular:     Rate and Rhythm: Normal rate.     Comments: Radial pulse 2+ equal bilaterally Pulmonary:     Effort: Pulmonary effort is normal. No respiratory distress.  Musculoskeletal:     Comments: Able to fully range elbow and wrist of right upper extremity  Skin:    Capillary Refill: Capillary refill takes less than 2 seconds.     Coloration: Skin is not jaundiced or pale.     Findings: Erythema present.     Comments: Half centimeter draining open wound with surrounding warmth, erythema to right forearm  Neurological:  General: No focal deficit present.     Mental Status: She is alert and oriented to person, place, and time. Mental status is at baseline.     Sensory: No sensory deficit.     Motor: No weakness.    ED Results / Procedures / Treatments   Labs (all labs ordered are listed, but only abnormal results are displayed) Labs Reviewed - No data to display  EKG None  Radiology No results found.  Procedures Procedures    Medications Ordered in ED Medications  ketorolac (TORADOL) 30 MG/ML injection 15 mg (15 mg Intramuscular Given 04/14/23 0724)    ED Course/ Medical  Decision Making/ A&P                                 Medical Decision Making   Patient presents to the ED for concern of insect bite, this involves an extensive number of treatment options, and is a complaint that carries with it a high risk of complications and morbidity.  The differential diagnosis includes infection, cellulitis, abscess   Additional history obtained:  Additional history obtained from Nursing and Outside Medical Records   External records from outside source obtained and reviewed including RN triage note   Medicines ordered and prescription drug management:  I ordered medication including Toradol  for pain management and doxy for cellulitis with MRSA coverage  Reevaluation of the patient after these medicines showed that the patient improved I have reviewed the patients home medicines and have made adjustments as needed    Problem List / ED Course:  Insect bite Cellulitis   Reevaluation:  After the interventions noted above, I reevaluated the patient and found that they have :improved   Dispostion:  Patient is resting comfortably in bed.  She is hypertensive at 160/104 and this is likely due to ED environment, pain.  See HPI.  Physical exam is remarkable for half centimeter open draining wound on right forearm with surrounding erythema, warmth.  She is able to fully range her right elbow and right wrist above and below the wound.  Will provide Toradol for pain management in ED and Doxy prescription for MRSA coverage of cellulitis.  After consideration of the diagnostic results and the patients response to treatment, I feel that the patent would benefit from outpatient management with antibiotics.  I discussed disposition, return to emergency department precautions to patient expresses understanding agrees with plan. I discussed the importance of taking entire antibiotic course and that it may cause mild GI upset.  Patient may follow-up with her PCP or  urgent care following antibiotic course for reassessment of wound.  I discussed symptomatic management to include elevation of wound, cooling packs.  Return to emergency department precautions to include but not limited to worsening swelling, pain, spreading of erythema, inability to flex right elbow or right wrist to full range of motion, sensation of throat closing.  All questions answered to patient's satisfaction.  All recommendation and abx instructions placed on discharge instructions.  Discussed plan with Dr. Adela Lank who agrees with plan.       Final Clinical Impression(s) / ED Diagnoses Final diagnoses:  Insect bite of right forearm, initial encounter  Cellulitis of right upper extremity    Rx / DC Orders ED Discharge Orders          Ordered    doxycycline (MONODOX) 100 MG capsule  2 times daily  04/14/23 0724              Judithann Sheen, PA 04/14/23 9811    Melene Plan, DO 04/14/23 1240

## 2023-04-14 NOTE — ED Triage Notes (Signed)
Pt reports at 11 am yesterday she thought she had an insect bite but now feels like it may be a spider bite, bite to right forearm, pt c/o pain and stiffness

## 2023-07-16 ENCOUNTER — Encounter (HOSPITAL_BASED_OUTPATIENT_CLINIC_OR_DEPARTMENT_OTHER): Payer: Self-pay

## 2023-07-21 ENCOUNTER — Ambulatory Visit (HOSPITAL_BASED_OUTPATIENT_CLINIC_OR_DEPARTMENT_OTHER): Payer: 59 | Admitting: Internal Medicine

## 2023-07-21 VITALS — BP 118/80 | HR 68 | Ht 65.0 in | Wt 211.0 lb

## 2023-07-21 DIAGNOSIS — R7982 Elevated C-reactive protein (CRP): Secondary | ICD-10-CM | POA: Diagnosis not present

## 2023-07-21 DIAGNOSIS — Z8249 Family history of ischemic heart disease and other diseases of the circulatory system: Secondary | ICD-10-CM | POA: Diagnosis not present

## 2023-07-21 DIAGNOSIS — E785 Hyperlipidemia, unspecified: Secondary | ICD-10-CM | POA: Diagnosis not present

## 2023-07-21 DIAGNOSIS — Z720 Tobacco use: Secondary | ICD-10-CM

## 2023-07-21 MED ORDER — ATORVASTATIN CALCIUM 40 MG PO TABS: 40.0000 mg | ORAL_TABLET | Freq: Every day | ORAL | 3 refills | Status: DC

## 2023-07-21 NOTE — Patient Instructions (Addendum)
 Medication Instructions:  START atorvastatin 40mg  once daily  *If you need a refill on your cardiac medications before your next appointment, please call your pharmacy*   Lab Work: FASTING lab work to check cholesterol in 4 months  If you have labs (blood work) drawn today and your tests are completely normal, you will receive your results only by: MyChart Message (if you have MyChart) OR A paper copy in the mail If you have any lab test that is abnormal or we need to change your treatment, we will call you to review the results.     Follow-Up: At [facility], you and your health needs are our priority.  As part of our continuing mission to provide you with exceptional heart care, we have created designated Provider Care Teams.  These Care Teams include your primary Cardiologist (physician) and Advanced Practice Providers (APPs -  Physician Assistants and Nurse Practitioners) who all work together to provide you with the care you need, when you need it.  We recommend signing up for the patient portal called "MyChart".  Sign up information is provided on this After Visit Summary.  MyChart is used to connect with patients for Virtual Visits (Telemedicine).  Patients are able to view lab/test results, encounter notes, upcoming appointments, etc.  Non-urgent messages can be sent to your provider as well.   To learn more about what you can do with MyChart, go to [email or web address].    Your next appointment:     6 months with Dr. Rennis Golden or Eligha Bridegroom NP   Please contact our office if you need assistance with smoking cessation

## 2023-07-21 NOTE — Addendum Note (Signed)
 Addended by: Lindell Spar on: 07/21/2023 01:21 PM   Modules accepted: Orders

## 2023-07-21 NOTE — Progress Notes (Signed)
 LIPID CLINIC CONSULT NOTE  Chief Complaint:  Manage dyslipidemia  Primary Care Physician: System, Provider Not In  Primary Cardiologist:  None  HPI:  Donna Parks is a 38 y.o. female who is being seen today for the evaluation of dyslipidemia at the request of Maxie Better, MD. this a pleasant 38 year old female kindly referred for evaluation management of dyslipidemia.  She has had her primary care through [facility].  She underwent some lab work there which showed an elevated cholesterol in November with total 251, triglycerides 210, HDL 29 and LDL 182.  Of note her high-sensitivity CRP was also elevated at 5.8.  She is a smoker and not yet ready to quit.  She was prescribed some atorvastatin which she took for about a month or so but then stopped taking it.  She has longstanding history of high cholesterol.  Her LDL was 159 in 2020.  She does report some dietary indiscretions including increase in saturated fats or cheeses and fried foods.  She is working on trying to reduce that as well as increased exercise and her diet.  There is a strong family history of heart disease including 4 out of 9 siblings who had some coronary disease and a maternal grandmother who had coronary bypass grafting.  PMHx:  Past Medical History:  Diagnosis Date   Asthma    Vaginal Pap smear, abnormal     Past Surgical History:  Procedure Laterality Date   TONSILLECTOMY      FAMHx:  Family History  Problem Relation Age of Onset   Cancer Other    Diabetes Other    Hypertension Other    Diabetes Mother    Healthy Father     SOCHx:   reports that she has been smoking cigarettes. She started smoking about 18 months ago. She has a 0.8 pack-year smoking history. She has never used smokeless tobacco. She reports that she does not drink alcohol and does not use drugs.  ALLERGIES:  Allergies  Allergen Reactions   Penicillins Hives and Other (See Comments)    Has patient had a  PCN reaction causing immediate rash, facial/tongue/throat swelling, SOB or lightheadedness with hypotension: No Has patient had a PCN reaction causing severe rash involving mucus membranes or skin necrosis: No Has patient had a PCN reaction that required hospitalization No Has patient had a PCN reaction occurring within the last 10 years: No If all of the above answers are "NO", then may proceed with Cephalosporin use.    ROS: Pertinent items noted in HPI and remainder of comprehensive ROS otherwise negative.  HOME MEDS: Current Outpatient Medications on File Prior to Visit  Medication Sig Dispense Refill   albuterol (VENTOLIN HFA) 108 (90 Base) MCG/ACT inhaler inhale 2 puffs by mouth four times a day     topiramate (TOPAMAX) 25 MG tablet Take 25 mg by mouth 2 (two) times daily as needed.     HYDROcodone-acetaminophen (NORCO/VICODIN) 5-325 MG tablet Take 1 tablet by mouth every 12 (twelve) hours as needed. (Patient not taking: Reported on 07/21/2023) 5 tablet 0   hydrocortisone (ANUSOL-HC) 2.5 % rectal cream Place 1 application rectally 2 (two) times daily. (Patient not taking: Reported on 07/21/2023) 30 g 0   hydrocortisone (ANUSOL-HC) 25 MG suppository Place 1 suppository (25 mg total) rectally 2 (two) times daily. (Patient not taking: Reported on 07/21/2023) 12 suppository 0   predniSONE (DELTASONE) 20 MG tablet Take 20 mg by mouth 2 (two) times daily. (Patient not taking:  Reported on 07/21/2023)     [DISCONTINUED] cetirizine (ZYRTEC) 10 MG chewable tablet Chew 1 tablet (10 mg total) by mouth daily. 20 tablet 0   [DISCONTINUED] fluticasone (FLONASE) 50 MCG/ACT nasal spray Place 2 sprays into both nostrils daily. 16 g 0   No current facility-administered medications on file prior to visit.    LABS/IMAGING: No results found for this or any previous visit (from the past 48 hours). No results found.  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   Donna Parks probably has a genetic dyslipidemia but there is probably dietary component as well.  We talked about ways to lower cholesterol and saturated fat intake.  Will provide some dietary tip information today.  She seemed to tolerate lower dose atorvastatin but per guidelines should be on high intensity statin therapy.  Will prescribe 40 mg atorvastatin daily at night.  Plan repeat lipids in about 3 to 4 months including NMR and LP(a).  Ultimately I would strongly encourage smoking cessation and I am able to help her with that if she chooses.  This should help with her elevated CRP.  We should check that again at some point to see if it is improved.  We did discuss that there are some data regarding treatment of elevated CRP with Lodoco, however I would try to remove any possible sources of inflammation, with tobacco abuse being the most likely.  Thanks again for the kind referral.  Donna Nose, MD, [facility]  [place]  [facility]  Medical Director of the Advanced Lipid Disorders &  Cardiovascular Risk Reduction Clinic Diplomate of the American Board of Clinical Lipidology Attending Cardiologist  Direct Dial: [phone]  Fax: [phone]  Website:  [email or web address] 07/21/2023, 10:53 AM

## 2023-10-28 ENCOUNTER — Telehealth: Payer: Self-pay | Admitting: Internal Medicine

## 2023-10-28 MED ORDER — ATORVASTATIN CALCIUM 40 MG PO TABS: 40.0000 mg | ORAL_TABLET | Freq: Every day | ORAL | 2 refills | Status: AC

## 2023-10-28 NOTE — Telephone Encounter (Signed)
*  STAT* If patient is at the pharmacy, call can be transferred to refill team.   1. Which medications need to be refilled? (please list name of each medication and dose if known) atorvastatin  (LIPITOR) 40 MG tablet (Expired)    2. Would you like to learn more about the convenience, safety, & potential cost savings by using the [facility]?    3. Are you open to using the [facility] (Type [facility].  ).   4. Which pharmacy/location (including street and city if local pharmacy) is medication to be sent to? [facility] - [place], [place] - [address] [facility] AT Portal to Registered [facility] Sites    5. Do they need a 30 day or 90 day supply? 90 day

## 2023-10-28 NOTE — Telephone Encounter (Signed)
 RX sent to requested Pharmacy

## 2023-12-11 ENCOUNTER — Encounter: Payer: Self-pay | Admitting: Advanced Practice Midwife
